# Patient Record
Sex: Male | Born: 1975 | Race: Black or African American | Hispanic: No | Marital: Married | Smoking: Never smoker
Health system: Southern US, Community
[De-identification: ages and names within clinical notes are randomized; demographics above are authoritative.]

## PROBLEM LIST (undated history)

## (undated) DIAGNOSIS — E785 Hyperlipidemia, unspecified: Secondary | ICD-10-CM

## (undated) DIAGNOSIS — D849 Immunodeficiency, unspecified: Secondary | ICD-10-CM

## (undated) DIAGNOSIS — I10 Essential (primary) hypertension: Secondary | ICD-10-CM

## (undated) DIAGNOSIS — E119 Type 2 diabetes mellitus without complications: Secondary | ICD-10-CM

## (undated) DIAGNOSIS — E7800 Pure hypercholesterolemia, unspecified: Secondary | ICD-10-CM

---

## 2012-01-11 DIAGNOSIS — I498 Other specified cardiac arrhythmias: Secondary | ICD-10-CM | POA: Insufficient documentation

## 2012-01-11 DIAGNOSIS — Z8249 Family history of ischemic heart disease and other diseases of the circulatory system: Secondary | ICD-10-CM | POA: Insufficient documentation

## 2012-01-11 DIAGNOSIS — E785 Hyperlipidemia, unspecified: Secondary | ICD-10-CM | POA: Insufficient documentation

## 2012-01-11 DIAGNOSIS — R079 Chest pain, unspecified: Principal | ICD-10-CM | POA: Insufficient documentation

## 2012-01-11 DIAGNOSIS — E7800 Pure hypercholesterolemia, unspecified: Secondary | ICD-10-CM | POA: Insufficient documentation

## 2012-01-11 DIAGNOSIS — E119 Type 2 diabetes mellitus without complications: Secondary | ICD-10-CM | POA: Insufficient documentation

## 2012-01-11 DIAGNOSIS — I10 Essential (primary) hypertension: Secondary | ICD-10-CM | POA: Insufficient documentation

## 2012-01-12 ENCOUNTER — Other Ambulatory Visit: Payer: Self-pay

## 2012-01-12 ENCOUNTER — Observation Stay (HOSPITAL_COMMUNITY)
Admission: EM | Admit: 2012-01-12 | Discharge: 2012-01-12 | Disposition: A | Discharge status: Home / Self Care | Payer: PRIVATE HEALTH INSURANCE | Attending: Internal Medicine | Admitting: Internal Medicine

## 2012-01-12 ENCOUNTER — Emergency Department (HOSPITAL_COMMUNITY)
Admit: 2012-01-12 | Discharge: 2012-01-12 | Disposition: A | Discharge status: Home / Self Care | Payer: PRIVATE HEALTH INSURANCE | Attending: Emergency Medicine | Admitting: Emergency Medicine

## 2012-01-12 ENCOUNTER — Encounter (HOSPITAL_COMMUNITY): Payer: Self-pay | Admitting: *Deleted

## 2012-01-12 DIAGNOSIS — R079 Chest pain, unspecified: Secondary | ICD-10-CM | POA: Diagnosis present

## 2012-01-12 DIAGNOSIS — E119 Type 2 diabetes mellitus without complications: Secondary | ICD-10-CM | POA: Diagnosis present

## 2012-01-12 DIAGNOSIS — I498 Other specified cardiac arrhythmias: Secondary | ICD-10-CM

## 2012-01-12 DIAGNOSIS — E785 Hyperlipidemia, unspecified: Secondary | ICD-10-CM | POA: Diagnosis present

## 2012-01-12 DIAGNOSIS — I10 Essential (primary) hypertension: Secondary | ICD-10-CM | POA: Diagnosis present

## 2012-01-12 DIAGNOSIS — R001 Bradycardia, unspecified: Secondary | ICD-10-CM | POA: Diagnosis present

## 2012-01-12 HISTORY — DX: Immunodeficiency, unspecified: D84.9

## 2012-01-12 HISTORY — DX: Hyperlipidemia, unspecified: E78.5

## 2012-01-12 HISTORY — DX: Type 2 diabetes mellitus without complications: E11.9

## 2012-01-12 HISTORY — DX: Pure hypercholesterolemia, unspecified: E78.00

## 2012-01-12 HISTORY — DX: Essential (primary) hypertension: I10

## 2012-01-12 LAB — COMPREHENSIVE METABOLIC PANEL
AST: 16 U/L (ref 0–37)
Albumin: 4.2 g/dL (ref 3.5–5.2)
BUN: 24 mg/dL — ABNORMAL HIGH (ref 6–23)
Creatinine, Ser: 1.08 mg/dL (ref 0.50–1.35)
Total Protein: 7.6 g/dL (ref 6.0–8.3)

## 2012-01-12 LAB — LIPID PANEL
Cholesterol: 161 mg/dL (ref 0–200)
LDL Cholesterol: 85 mg/dL (ref 0–99)
Total CHOL/HDL Ratio: 2.9 RATIO
VLDL: 20 mg/dL (ref 0–40)

## 2012-01-12 LAB — CBC WITH DIFFERENTIAL/PLATELET
Basophils Absolute: 0 10*3/uL (ref 0.0–0.1)
Basophils Relative: 0 % (ref 0–1)
Eosinophils Absolute: 0.1 10*3/uL (ref 0.0–0.7)
HCT: 40.3 % (ref 39.0–52.0)
Hemoglobin: 13.5 g/dL (ref 13.0–17.0)
MCH: 28.3 pg (ref 26.0–34.0)
MCHC: 33.5 g/dL (ref 30.0–36.0)
Monocytes Absolute: 0.4 10*3/uL (ref 0.1–1.0)
Monocytes Relative: 5 % (ref 3–12)
Neutro Abs: 3.6 10*3/uL (ref 1.7–7.7)
Neutrophils Relative %: 50 % (ref 43–77)
RDW: 13.6 % (ref 11.5–15.5)

## 2012-01-12 LAB — GLUCOSE, CAPILLARY
Glucose-Capillary: 99 mg/dL (ref 70–99)
Glucose-Capillary: 88 mg/dL (ref 70–99)
Glucose-Capillary: 84 mg/dL (ref 70–99)

## 2012-01-12 LAB — CREATININE, SERUM
Creatinine, Ser: 1.02 mg/dL (ref 0.50–1.35)
GFR calc Af Amer: >90 mL/min (ref >90)
GFR calc non Af Amer: >90 mL/min (ref >90)

## 2012-01-12 LAB — CBC
HCT: 40.1 % (ref 39.0–52.0)
Platelets: 274 10*3/uL (ref 150–400)
RDW: 13.8 % (ref 11.5–15.5)
WBC: 7.2 10*3/uL (ref 4.0–10.5)

## 2012-01-12 LAB — TROPONIN I: Troponin I: <0.3 ng/mL (ref <0.30)

## 2012-01-12 MED ORDER — ACETAMINOPHEN 650 MG RE SUPP: 650.0000 mg | Freq: Four times a day (QID) | RECTAL | Status: DC | PRN

## 2012-01-12 MED ORDER — LISINOPRIL 2.5 MG PO TABS
2.5000 mg | ORAL_TABLET | Freq: Every day | ORAL | Status: DC
  Administered 2012-01-12: 2.5 mg via ORAL
  Filled 2012-01-12: qty 1

## 2012-01-12 MED ORDER — ACETAMINOPHEN 325 MG PO TABS: 650.0000 mg | ORAL_TABLET | Freq: Four times a day (QID) | ORAL | Status: DC | PRN

## 2012-01-12 MED ORDER — DOCUSATE SODIUM 100 MG PO CAPS
100.0000 mg | ORAL_CAPSULE | Freq: Two times a day (BID) | ORAL | Status: DC
  Administered 2012-01-12: 100 mg via ORAL
  Filled 2012-01-12 (×2): qty 1

## 2012-01-12 MED ORDER — INSULIN ASPART 100 UNIT/ML ~~LOC~~ SOLN
0.0000 [IU] | Freq: Every day | SUBCUTANEOUS | Status: DC
Start: 2012-01-12 — End: 2012-01-12

## 2012-01-12 MED ORDER — SODIUM CHLORIDE 0.9 % IJ SOLN
3.0000 mL | INTRAMUSCULAR | Status: DC | PRN
  Administered 2012-01-12: 3 mL via INTRAVENOUS

## 2012-01-12 MED ORDER — ONDANSETRON HCL 4 MG PO TABS: 4.0000 mg | ORAL_TABLET | Freq: Four times a day (QID) | ORAL | Status: DC | PRN

## 2012-01-12 MED ORDER — ONDANSETRON HCL 4 MG/2ML IJ SOLN: 4.0000 mg | Freq: Four times a day (QID) | INTRAMUSCULAR | Status: DC | PRN

## 2012-01-12 MED ORDER — SODIUM CHLORIDE 0.9 % IJ SOLN: 3.0000 mL | Freq: Two times a day (BID) | INTRAMUSCULAR | Status: DC

## 2012-01-12 MED ORDER — HYDROCHLOROTHIAZIDE 25 MG PO TABS
25.0000 mg | ORAL_TABLET | Freq: Every day | ORAL | Status: DC
  Administered 2012-01-12: 25 mg via ORAL
  Filled 2012-01-12: qty 1

## 2012-01-12 MED ORDER — ENOXAPARIN SODIUM 40 MG/0.4ML ~~LOC~~ SOLN
40.0000 mg | SUBCUTANEOUS | Status: DC
  Administered 2012-01-12: 40 mg via SUBCUTANEOUS
  Filled 2012-01-12: qty 0.4

## 2012-01-12 MED ORDER — PANTOPRAZOLE SODIUM 40 MG PO TBEC
40.0000 mg | DELAYED_RELEASE_TABLET | Freq: Every day | ORAL | Status: DC
  Administered 2012-01-12: 40 mg via ORAL
  Filled 2012-01-12: qty 1

## 2012-01-12 MED ORDER — HYDROCODONE-ACETAMINOPHEN 5-325 MG PO TABS: 1.0000 | ORAL_TABLET | ORAL | Status: DC | PRN

## 2012-01-12 MED ORDER — IBUPROFEN 800 MG PO TABS: 800.0000 mg | ORAL_TABLET | Freq: Three times a day (TID) | ORAL | Status: DC

## 2012-01-12 MED ORDER — SODIUM CHLORIDE 0.9 % IV SOLN: 250.0000 mL | INTRAVENOUS | Status: DC | PRN

## 2012-01-12 MED ORDER — ATORVASTATIN CALCIUM 10 MG PO TABS
10.0000 mg | ORAL_TABLET | Freq: Every day | ORAL | Status: DC
  Administered 2012-01-12: 10 mg via ORAL
  Filled 2012-01-12: qty 1

## 2012-01-12 MED ORDER — ALUM & MAG HYDROXIDE-SIMETH 200-200-20 MG/5ML PO SUSP: 30.0000 mL | Freq: Four times a day (QID) | ORAL | Status: DC | PRN

## 2012-01-12 MED ORDER — IBUPROFEN 800 MG PO TABS
800.0000 mg | ORAL_TABLET | Freq: Three times a day (TID) | ORAL | Status: DC
  Filled 2012-01-12 (×3): qty 1

## 2012-01-12 MED ORDER — ASPIRIN 81 MG PO CHEW
324.0000 mg | CHEWABLE_TABLET | Freq: Once | ORAL | Status: AC
  Administered 2012-01-12: 324 mg via ORAL
  Filled 2012-01-12: qty 4

## 2012-01-12 MED ORDER — ASPIRIN EC 81 MG PO TBEC
81.0000 mg | DELAYED_RELEASE_TABLET | Freq: Every day | ORAL | Status: DC
  Administered 2012-01-12: 81 mg via ORAL
  Filled 2012-01-12: qty 1

## 2012-01-12 MED ORDER — INSULIN ASPART 100 UNIT/ML ~~LOC~~ SOLN: 0.0000 [IU] | Freq: Three times a day (TID) | SUBCUTANEOUS | Status: DC

## 2012-01-12 NOTE — Consult Note (Signed)
Admit date: 01/12/2012 Referring Physician  Dr. Janee Morn Primary Physician Pcp Not In System Primary Cardiologist  Dr. Anne Fu Reason for Consultation  chest pain  HPI: 36 year old male with diabetes, hypertension, hyperlipidemia, early family history of CAD with his father having a cardiac death in his mid 5s here with chest pain with radiation to his left upper arm, squeezing-like sensation intermittent what she describes after taking metformin for the past 2 days. He is currently chest pain-free. Previously taking a deep breath make it feel better. He had some mild lightheadedness. He has had anxiety in the past. No associated shortness of breath, diaphoresis, vomiting. He exercises at the gym quite frequently and this does not bother him. I evaluated him in December of 2012 performed an exercise treadmill test that was reassuring exercising 9 minutes and 30 seconds with no ST segment changes.    PMH:   Past Medical History  Diagnosis Date  . Hypertension   . Immune deficiency disorder   . Diabetes mellitus without complication   . Hypercholesteremia   . Hyperlipidemia 01/12/2012    PSH:  History reviewed. No pertinent past surgical history. Allergies:  Review of patient's allergies indicates no known allergies. Prior to Admit Meds:   Prescriptions prior to admission  Medication Sig Dispense Refill  . atorvastatin (LIPITOR) 10 MG tablet Take 10 mg by mouth daily.      . hydrochlorothiazide (HYDRODIURIL) 25 MG tablet Take 25 mg by mouth daily.      Marland Kitchen lisinopril (PRINIVIL,ZESTRIL) 2.5 MG tablet Take 2.5 mg by mouth daily.      . metFORMIN (GLUCOPHAGE) 500 MG tablet Take 500 mg by mouth 2 (two) times daily with a meal.      . Omega-3 Fatty Acids (FISH OIL PO) Take 1 capsule by mouth 2 (two) times daily.      . propranolol (INDERAL) 10 MG tablet Take 10 mg by mouth 2 (two) times daily.       Fam HX:    Family History  Problem Relation Age of Onset  . Hypertension Mother   . Kidney  failure Mother   . Diabetes Father    Social HX:    History   Social History  . Marital Status: Married    Spouse Name: N/A    Number of Children: N/A  . Years of Education: N/A   Occupational History  . Not on file.   Social History Main Topics  . Smoking status: Never Smoker   . Smokeless tobacco: Not on file  . Alcohol Use: No  . Drug Use: No  . Sexually Active:    Other Topics Concern  . Not on file   Social History Narrative  . No narrative on file     ROS: Denies any syncope, bleeding, orthopnea, PND, palpitations, strokelike symptoms All 11 ROS were addressed and are negative except what is stated in the HPI  Physical Exam: Blood pressure 110/70, pulse 52, temperature 97.3 F (36.3 C), temperature source Oral, resp. rate 16, height 5\' 8"  (1.727 m), weight 100.426 kg (221 lb 6.4 oz), SpO2 100.00%.    General: Well developed, well nourished, in no acute distress Head: Eyes PERRLA, No xanthomas.   Normal cephalic and atramatic  Lungs:   Clear bilaterally to auscultation and percussion. Normal respiratory effort. No wheezes, no rales. Heart:   HRRR S1 S2 Pulses are 2+ & equal. No m/r/g            No carotid bruit. No JVD.  No abdominal bruits. No femoral bruits. Abdomen: Bowel sounds are positive, abdomen soft and non-tender without masses. No hepatosplenomegaly. Msk:  Back normal. Normal strength and tone for age. Extremities:   No clubbing, cyanosis or edema.  DP +1 Neuro: Alert and oriented X 3, non-focal, MAE x 4 GU: Deferred Rectal: Deferred Psych:  Good affect, responds appropriately    Labs:   Lab Results  Component Value Date   WBC 7.2 01/12/2012   HGB 13.3 01/12/2012   HCT 40.1 01/12/2012   MCV 84.4 01/12/2012   PLT 274 01/12/2012    Lab 01/12/12 0710 01/12/12 0025  NA -- 141  K -- 4.3  CL -- 104  CO2 -- 28  BUN -- 24*  CREATININE 1.02 --  CALCIUM -- 10.0  PROT -- 7.6  BILITOT -- 0.2*  ALKPHOS -- 57  ALT -- 17  AST -- 16  GLUCOSE --  104*   No results found for this basename: PTT   No results found for this basename: INR, PROTIME   Lab Results  Component Value Date   TROPONINI <0.30 01/12/2012     Lab Results  Component Value Date   CHOL 161 01/12/2012   Lab Results  Component Value Date   HDL 56 01/12/2012   Lab Results  Component Value Date   LDLCALC 85 01/12/2012   Lab Results  Component Value Date   TRIG 102 01/12/2012   Lab Results  Component Value Date   CHOLHDL 2.9 01/12/2012   No results found for this basename: LDLDIRECT      Radiology:  Dg Chest 2 View  01/12/2012  *RADIOLOGY REPORT*  Clinical Data: Chest pain.  CHEST - 2 VIEW  Comparison: 06/03/2010 chest radiograph from [facility]  Findings: Cardiac and mediastinal contours appear normal.  The lungs appear clear.  No pleural effusion is identified.  IMPRESSION:  No significant abnormality identified.   Original Report Authenticated By: Dellia Cloud, M.D.    Personally viewed.  EKG:  Sinus bradycardia with no ST segment changes, no change from prior EKG. Telemetry overnight occasional heart rate upper 40s low 50s. Asymptomatic. Personally viewed.   ASSESSMENT/PLAN:   36 year old male with multiple cardiac risk factors including diabetes, hypertension, hyperlipidemia, family history of premature coronary artery disease with substernal chest pain after restarting his metformin with radiation to his left upper extremity.  1. Chest pain-prior stress test in December reviewed, he was able to exercise for 9 minutes and 30 seconds without any ST segment changes. Overall low risk study. However given his multiple cardiac risk factors and recurrence of chest discomfort including left upper arm discomfort, I will set him up for a nuclear stress test/treadmill in the outpatient setting. His chest discomfort is still somewhat atypical and seems to be associated with possible metformin. I'm not sure if I can explain that. Troponin is  normal. EKG silent. Chest pain-free. Echocardiogram currently being performed, personally viewed shows no wall motion abnormality. Final read to follow. No evidence of pericardial effusion.  2. Bradycardia-agree with holding beta blockers. I would likely discontinue them prior to discharge.  3. Diabetes-may need separate agent other than metformin. Per primary team.  4. Early family history of CAD-father died in his 78s, MI  I will contact him via my office on Monday to schedule stress test. He knows to contact us if any further worrisome symptoms develop. Refrain from heavy exercise until stress test is performed. From my point of view, I am fine with him  being discharged with close followup.  Donato Schultz, MD  01/12/2012  10:58 AM

## 2012-01-12 NOTE — Discharge Summary (Signed)
Physician Discharge Summary  Cole Ford:[id number] DOB: [birth year] DOA: 01/12/2012  PCP: Pcp Not In System  Admit date: 01/12/2012 Discharge date: 01/12/2012  Recommendations for Outpatient Follow-up:  1. Patient will be called by [facility] cardiology followup appointment for outpatient nuclear stress test and followup with Dr. Anne Fu of [facility] cardiology. 2. Patient is to followup with his PCP one week post discharge. On followup, patient's blood pressure need to be reassessed as the propranolol has been discontinued secondary to bradycardia. Patient's diabetes also need to be reassessed as his metformin has been discontinued secondary to his episodes of chest pain. PCP may consider starting patient on a different oral hypoglycemic agent.  Discharge Diagnoses:  Principal Problem:  *Chest pain Active Problems:  Diabetes  Hypertension  Bradycardia  Hyperlipidemia   Discharge Condition: Stable and improved  Diet recommendation: Carb modified diet  Filed Weights   01/12/12 0535  Weight: 100.426 kg (221 lb 6.4 oz)    History of present illness:  Cole Ford is a 36 y.o. male  has a past medical history of Hypertension; Immune deficiency disorder; Diabetes mellitus without complication; and Hypercholesteremia.  Presented with  Restarted his metformin 2 days ago and started to have chest pain that is squeezing like. Radiating to left arm and between shoulders. Taking deep breath makes it better. He have had some lightheadedness. No SOB, no nausea or vomiting. Chest pain worse with stress. Going to the Gym does not bother him. He have had stress test last December and it was ok. He does not check his blood sugar tries to exercise and have a good diet   Hospital Course:  #1 chest pain Patient was admitted with chest pain with multiple cardiac risk factors of diabetes hypertension probable hyperlipidemia a strong family history of coronary artery disease. Patient was placed on  telemetry and followed. Patient was also placed on aspirin as well. Patient did state that his chest pain had been intermittent and started after taking metformin. EKG which was done did show a bradycardia however no ST-T wave changes. Cardiac enzymes which was cycled were negative x3. A fasting lipid panel which was obtained showed a LDL of 85. Patient improved clinically did not have any further chest pain during the hospitalization. Due to his multiple risk factors a cardiac consultation was obtained and patient was seen in consultation by Dr. Anne Fu of [facility] cardiology on 01/12/2012. Patient had a 2-D echo done had a normal ejection fraction with no wall motion abnormalities. It was noted that patient did have an exercise stress test done in December of 2012 for 9 minutes and 30 seconds without any ST segment changes. It was felt that patient's overall risk was low however due to his multiple risk factors recurrence of his chest discomfort with left upper extremity discomfort and was recommended that patient will benefit from a nuclear stress test in the outpatient setting. Patient was also placed on scheduled ibuprofen. Patient will be discharged home on scheduled ibuprofen for the next 5 days and he will be scheduled for outpatient nuclear stress test per cardiology. Patient be discharged in stable and improved condition.  #2 bradycardia On admission patient was noted to have a bradycardia. Cardiac enzymes which was cycled were negative x3. 2-D echo which was obtained did have a normal ejection fraction of 55-60% with no wall motion abnormalities. Patient was asymptomatic. Patient's propranolol was discontinued and will not be resumed on discharge. Patient will followup with cardiology as outpatient.  #3 hypertension Remained stable throughout  the hospitalization. Patient was maintained on his home regimen of lisinopril and HCTZ. Patient's propranolol was discontinued secondary to bradycardia. Patient  will followup with his PCP as outpatient.  #4 diabetes mellitus Controlled. Patient CBGs remained in the 80s during the hospitalization. Patient was on metformin at home however after starting his metformin he did have some intermittent chest pain which was similar to his episode 1 year ago. Patient's metformin has been discontinued and will not be resumed on discharge. Patient is to followup with his PCP one week post discharge for his assessment of diabetes and possible switch of metformin to another oral hypoglycemic agent.  Procedures:  2-D echo 01/12/2012  Consultations:  Cardiology: Dr. Anne Fu 01/12/2012  Discharge Exam: Filed Vitals:   01/12/12 0407 01/12/12 0535 01/12/12 0930 01/12/12 1300  BP: 117/75 123/64 110/70 116/64  Pulse: 52 52  61  Temp:  97.3 F (36.3 C)  97.3 F (36.3 C)  TempSrc:    Oral  Resp:  16  20  Height:  5\' 8"  (1.727 m)    Weight:  100.426 kg (221 lb 6.4 oz)    SpO2: 100% 100%  100%    General: NAD Cardiovascular: RRR Respiratory: Soft/NT/ND/+BS  Discharge Instructions  Discharge Orders    Future Orders Please Complete By Expires   Diet Carb Modified      Increase activity slowly      Discharge instructions      Comments:   Follow up with Dr Anne Fu next week Follow up with Pcp in 1 week.       Medication List     As of 01/12/2012  5:14 PM    STOP taking these medications         metFORMIN 500 MG tablet   Commonly known as: GLUCOPHAGE      propranolol 10 MG tablet   Commonly known as: INDERAL      TAKE these medications         atorvastatin 10 MG tablet   Commonly known as: LIPITOR   Take 10 mg by mouth daily.      FISH OIL PO   Take 1 capsule by mouth 2 (two) times daily.      hydrochlorothiazide 25 MG tablet   Commonly known as: HYDRODIURIL   Take 25 mg by mouth daily.      ibuprofen 800 MG tablet   Commonly known as: ADVIL,MOTRIN   Take 1 tablet (800 mg total) by mouth 3 (three) times daily. Take 1 tablet 3 times  daily for 5 days then take as needed.      lisinopril 2.5 MG tablet   Commonly known as: PRINIVIL,ZESTRIL   Take 2.5 mg by mouth daily.           Follow-up Information    Please follow up. (Followup with PCP one week post discharge)       Follow up with Donato Schultz, MD. Schedule an appointment as soon as possible for a visit in 1 week. (Dr. Anne Fu office will call with outpatient appointment for stress test)    Contact information:   [address] [place] [address] [phone]           The results of significant diagnostics from this hospitalization (including imaging, microbiology, ancillary and laboratory) are listed below for reference.    Significant Diagnostic Studies: Dg Chest 2 View  01/12/2012  *RADIOLOGY REPORT*  Clinical Data: Chest pain.  CHEST - 2 VIEW  Comparison: 06/03/2010 chest radiograph from  [facility]  Findings: Cardiac and mediastinal contours appear normal.  The lungs appear clear.  No pleural effusion is identified.  IMPRESSION:  No significant abnormality identified.   Original Report Authenticated By: Dellia Cloud, M.D.     Microbiology: No results found for this or any previous visit (from the past 240 hour(s)).   Labs: Basic Metabolic Panel:  Lab 01/12/12 4010 01/12/12 0025  NA -- 141  K -- 4.3  CL -- 104  CO2 -- 28  GLUCOSE -- 104*  BUN -- 24*  CREATININE 1.02 1.08  CALCIUM -- 10.0  MG -- --  PHOS -- --   Liver Function Tests:  Lab 01/12/12 0025  AST 16  ALT 17  ALKPHOS 57  BILITOT 0.2*  PROT 7.6  ALBUMIN 4.2   No results found for this basename: LIPASE:5,AMYLASE:5 in the last 168 hours No results found for this basename: AMMONIA:5 in the last 168 hours CBC:  Lab 01/12/12 0710 01/12/12 0025  WBC 7.2 7.3  NEUTROABS -- 3.6  HGB 13.3 13.5  HCT 40.1 40.3  MCV 84.4 84.5  PLT 274 286   Cardiac Enzymes:  Lab 01/12/12 1141 01/12/12 0710 01/12/12 0025  CKTOTAL -- -- --  CKMB -- -- --  CKMBINDEX  -- -- --  TROPONINI <0.30 <0.30 <0.30   BNP: BNP (last 3 results) No results found for this basename: PROBNP:3 in the last 8760 hours CBG:  Lab 01/12/12 1208 01/12/12 0620  GLUCAP 88 99    Time coordinating discharge: 60 minutes  Signed:  Zaneta Lightcap  [facility] 01/12/2012, 5:14 PM

## 2012-01-12 NOTE — Progress Notes (Signed)
I have seen and assessed the patient and I agree Dr. Celene Kras was assessment and plan. Patient is a 36 year old gentleman history of diabetes, hypertension, hyperlipidemia, family history of premature coronary artery disease who presents with substernal chest pain radiating to the left upper extremity. Patient states had a stress test done as outpatient in December of 2012 which was normal. Patient however states this time around his chest pain with some radiation to the left upper extremity. Patient does have multiple risk factors and a strong family history of premature coronary artery disease. Troponins are negative x2. Will order a 2-D echo. Will consult with cardiology for further evaluation and management. We'll also place patient on scheduled ibuprofen and follow.

## 2012-01-12 NOTE — ED Notes (Signed)
The pt has had lt upper chest pain since yesterday with some lt arm radiation and posterorly  With dizzinesss

## 2012-01-12 NOTE — ED Notes (Signed)
The pt has no chest pain 

## 2012-01-12 NOTE — ED Provider Notes (Signed)
History     CSN: [id number]  Arrival date & time 01/11/12  2355   First MD Initiated Contact with Patient 01/12/12 0256      Chief Complaint  Patient presents with  . Chest Pain    (Consider location/radiation/quality/duration/timing/severity/associated sxs/prior treatment) HPI Hx per PT. L sided CP radiating to L shoulder. Described as tightness. Treated for DM and HTN, mother with CAD. No SOB or nasuea. No trauma, no recent weight lifting. No rash or recent travel, no cough or fevers, no leg pain or swelling. Moderate in severity, takes baby ASA every day last 7 months.  Past Medical History  Diagnosis Date  . Hypertension   . Immune deficiency disorder     History reviewed. No pertinent past surgical history.  No family history on file.  History  Substance Use Topics  . Smoking status: Never Smoker   . Smokeless tobacco: Not on file  . Alcohol Use: No      Review of Systems  Constitutional: Negative for fever and chills.  HENT: Negative for neck pain and neck stiffness.   Eyes: Negative for pain.  Respiratory: Negative for shortness of breath.   Cardiovascular: Positive for chest pain.  Gastrointestinal: Negative for abdominal pain.  Genitourinary: Negative for dysuria.  Musculoskeletal: Negative for back pain.  Skin: Negative for rash.  Neurological: Negative for headaches.  All other systems reviewed and are negative.    Allergies  Review of patient's allergies indicates no known allergies.  Home Medications   Current Outpatient Rx  Name Route Sig Dispense Refill  . ATORVASTATIN CALCIUM 10 MG PO TABS Oral Take 10 mg by mouth daily.    Marland Kitchen HYDROCHLOROTHIAZIDE 25 MG PO TABS Oral Take 25 mg by mouth daily.    Marland Kitchen LISINOPRIL 2.5 MG PO TABS Oral Take 2.5 mg by mouth daily.    Marland Kitchen METFORMIN HCL 500 MG PO TABS Oral Take 500 mg by mouth 2 (two) times daily with a meal.    . FISH OIL PO Oral Take 1 capsule by mouth 2 (two) times daily.    Marland Kitchen PROPRANOLOL HCL 10 MG  PO TABS Oral Take 10 mg by mouth 2 (two) times daily.      BP 131/85  Pulse 48  Temp 97.6 F (36.4 C) (Oral)  SpO2 99%  Physical Exam  Constitutional: He is oriented to person, place, and time. He appears well-developed and well-nourished.  HENT:  Head: Normocephalic and atraumatic.  Eyes: Conjunctivae normal and EOM are normal. Pupils are equal, round, and reactive to light.  Neck: Trachea normal. Neck supple. No thyromegaly present.  Cardiovascular: Normal rate, regular rhythm, S1 normal, S2 normal and normal pulses.     No systolic murmur is present   No diastolic murmur is present  Pulses:      Radial pulses are 2+ on the right side, and 2+ on the left side.  Pulmonary/Chest: Effort normal and breath sounds normal. He has no wheezes. He has no rhonchi. He has no rales. He exhibits no tenderness.  Abdominal: Soft. Normal appearance and bowel sounds are normal. There is no tenderness. There is no CVA tenderness and negative Murphy's sign.  Musculoskeletal:       BLE:s Calves nontender, no cords or erythema, negative Homans sign  Neurological: He is alert and oriented to person, place, and time. He has normal strength. No cranial nerve deficit or sensory deficit. GCS eye subscore is 4. GCS verbal subscore is 5. GCS motor subscore is 6.  Skin: Skin is warm and dry. No rash noted. He is not diaphoretic.  Psychiatric: His speech is normal.       Cooperative and appropriate    ED Course  Procedures (including critical care time)  Results for orders placed during the hospital encounter of 01/12/12  CBC WITH DIFFERENTIAL      Component Value Range   WBC 7.3  4.0 - 10.5 K/uL   RBC 4.77  4.22 - 5.81 MIL/uL   Hemoglobin 13.5  13.0 - 17.0 g/dL   HCT 16.1  09.6 - 04.5 %   MCV 84.5  78.0 - 100.0 fL   MCH 28.3  26.0 - 34.0 pg   MCHC 33.5  30.0 - 36.0 g/dL   RDW 40.9  81.1 - 91.4 %   Platelets 286  150 - 400 K/uL   Neutrophils Relative 50  43 - 77 %   Neutro Abs 3.6  1.7 - 7.7 K/uL     Lymphocytes Relative 43  12 - 46 %   Lymphs Abs 3.2  0.7 - 4.0 K/uL   Monocytes Relative 5  3 - 12 %   Monocytes Absolute 0.4  0.1 - 1.0 K/uL   Eosinophils Relative 1  0 - 5 %   Eosinophils Absolute 0.1  0.0 - 0.7 K/uL   Basophils Relative 0  0 - 1 %   Basophils Absolute 0.0  0.0 - 0.1 K/uL  COMPREHENSIVE METABOLIC PANEL      Component Value Range   Sodium 141  135 - 145 mEq/L   Potassium 4.3  3.5 - 5.1 mEq/L   Chloride 104  96 - 112 mEq/L   CO2 28  19 - 32 mEq/L   Glucose, Bld 104 (*) 70 - 99 mg/dL   BUN 24 (*) 6 - 23 mg/dL   Creatinine, Ser 7.82  0.50 - 1.35 mg/dL   Calcium 95.6  8.4 - 21.3 mg/dL   Total Protein 7.6  6.0 - 8.3 g/dL   Albumin 4.2  3.5 - 5.2 g/dL   AST 16  0 - 37 U/L   ALT 17  0 - 53 U/L   Alkaline Phosphatase 57  39 - 117 U/L   Total Bilirubin 0.2 (*) 0.3 - 1.2 mg/dL   GFR calc non Af Amer 88 (*) >90 mL/min   GFR calc Af Amer >90  >90 mL/min  TROPONIN I      Component Value Range   Troponin I <0.30  <0.30 ng/mL   Dg Chest 2 View  01/12/2012  *RADIOLOGY REPORT*  Clinical Data: Chest pain.  CHEST - 2 VIEW  Comparison: 06/03/2010 chest radiograph from [facility]  Findings: Cardiac and mediastinal contours appear normal.  The lungs appear clear.  No pleural effusion is identified.  IMPRESSION:  No significant abnormality identified.   Original Report Authenticated By: Dellia Cloud, M.D.       Date: 01/12/2012  Rate: 53  Rhythm: sinus bradycardia  QRS Axis: normal  Intervals: normal  ST/T Wave abnormalities: nonspecific ST changes  Conduction Disutrbances:none  Narrative Interpretation:   Old EKG Reviewed: none available  ASA.   Serial evaluations with pain resolved in ED.   3:37 AM d/w [facility] on call Dr Kara Pacer, plan admit.  MDM   CP with multiple risk factors. ECG, labs, xray reviewed. Plan MED admit        Sunnie Nielsen, MD 01/12/12 [phone]

## 2012-01-12 NOTE — Progress Notes (Signed)
All d/c instructions explained and given to pt.  Verbalized understanding.  D/C home.  Pt refused w/c service.  Amanda Pea, Charity fundraiser.

## 2012-01-12 NOTE — Progress Notes (Signed)
  Echocardiogram 2D Echocardiogram has been performed.  Cole Ford 01/12/2012, 12:31 PM

## 2012-01-12 NOTE — ED Notes (Signed)
Report called to alecia on 4700

## 2012-01-12 NOTE — H&P (Signed)
PCP:  [facility]    Chief Complaint:   Chest pain  HPI: Cole Ford is a 36 y.o. male   has a past medical history of Hypertension; Immune deficiency disorder; Diabetes mellitus without complication; and Hypercholesteremia.   Presented with  Restarted his metformin 2 days ago and started to have chest pain that is squeezing like. Radiating to left arm and between shoulders. Taking deep breath makes it better. He have had some lightheadedness. No SOB, no nausea or vomiting. Chest pain worse with stress. Going to the Gym does not bother him. He have had stress test last December and it was ok. He does not check his blood sugar tries to exercise and have a good diet.   Review of Systems:    Pertinent positives include: chest pain, he has frequent band like headaches.   Constitutional:  No weight loss, night sweats, Fevers, chills, fatigue, weight loss  HEENT:  No headaches, Difficulty swallowing,Tooth/dental problems,Sore throat,  No sneezing, itching, ear ache, nasal congestion, post nasal drip,  Cardio-vascular:  No Orthopnea, PND, anasarca, dizziness, palpitations.no Bilateral lower extremity swelling  GI:  No heartburn, indigestion, abdominal pain, nausea, vomiting, diarrhea, change in bowel habits, loss of appetite, melena, blood in stool, hematemesis Resp:  no shortness of breath at rest. No dyspnea on exertion, No excess mucus, no productive cough, No non-productive cough, No coughing up of blood.No change in color of mucus.No wheezing. Skin:  no rash or lesions. No jaundice GU:  no dysuria, change in color of urine, no urgency or frequency. No straining to urinate.  No flank pain.  Musculoskeletal:  No joint pain or no joint swelling. No decreased range of motion. No back pain.  Psych:  No change in mood or affect. No depression or anxiety. No memory loss.  Neuro: no localizing neurological complaints, no tingling, no weakness, no double vision, no gait abnormality, no  slurred speech, no confusion  Otherwise ROS are negative except for above, 10 systems were reviewed  Past Medical History: Past Medical History  Diagnosis Date  . Hypertension   . Immune deficiency disorder   . Diabetes mellitus without complication   . Hypercholesteremia    History reviewed. No pertinent past surgical history.   Medications: Prior to Admission medications   Medication Sig Start Date End Date Taking? Authorizing Provider  atorvastatin (LIPITOR) 10 MG tablet Take 10 mg by mouth daily.   Yes Historical Provider, MD  hydrochlorothiazide (HYDRODIURIL) 25 MG tablet Take 25 mg by mouth daily.   Yes Historical Provider, MD  lisinopril (PRINIVIL,ZESTRIL) 2.5 MG tablet Take 2.5 mg by mouth daily.   Yes Historical Provider, MD  metFORMIN (GLUCOPHAGE) 500 MG tablet Take 500 mg by mouth 2 (two) times daily with a meal.   Yes Historical Provider, MD  Omega-3 Fatty Acids (FISH OIL PO) Take 1 capsule by mouth 2 (two) times daily.   Yes Historical Provider, MD  propranolol (INDERAL) 10 MG tablet Take 10 mg by mouth 2 (two) times daily.   Yes Historical Provider, MD    Allergies:  No Known Allergies  Social History:  Ambulatory  independently Lives at home   reports that he has never smoked. He does not have any smokeless tobacco history on file. He reports that he does not drink alcohol or use illicit drugs.   Family History: family history includes Diabetes in his father; Hypertension in his mother; and Kidney failure in his mother.    Physical Exam: Patient Vitals for the past  24 hrs:  BP Temp Temp src Pulse Resp SpO2  01/12/12 0407 117/75 mmHg - - 52  - 100 %  01/12/12 0315 116/66 mmHg - - 49  18  98 %  01/12/12 0300 109/68 mmHg - - 49  17  92 %  01/12/12 0245 103/64 mmHg - - 52  17  92 %  01/12/12 0230 116/77 mmHg - - 48  11  96 %  01/12/12 0215 116/77 mmHg - - 54  15  100 %  01/12/12 0200 119/79 mmHg - - 46  20  98 %  01/12/12 0145 116/55 mmHg - - 46  17  98 %    01/12/12 0130 124/87 mmHg - - 48  25  98 %  01/12/12 0119 131/85 mmHg 97.6 F (36.4 C) - 48  - 99 %  01/12/12 0003 131/77 mmHg 97.6 F (36.4 C) Oral 52  - 99 %    1. General:  in No Acute distress 2. Psychological: Alert and   Oriented 3. Head/ENT:   Moist  Mucous Membranes                          Head Non traumatic, neck supple                          Normal   Dentition 4. SKIN: normal  Skin turgor,  Skin clean Dry and intact no rash 5. Heart: Regular rate and rhythm no Murmur, Rub or gallop 6. Lungs: Clear to auscultation bilaterally, no wheezes or crackles   7. Abdomen: Soft, non-tender, Non distended 8. Lower extremities: no clubbing, cyanosis, or edema 9. Neurologically Grossly intact, moving all 4 extremities equally 10. MSK: Normal range of motion  body mass index is unknown because there is no height or weight on file.   Labs on Admission:   [facility] 01/12/12 0025  NA 141  K 4.3  CL 104  CO2 28  GLUCOSE 104*  BUN 24*  CREATININE 1.08  CALCIUM 10.0  MG --  PHOS --    Basename 01/12/12 0025  AST 16  ALT 17  ALKPHOS 57  BILITOT 0.2*  PROT 7.6  ALBUMIN 4.2   No results found for this basename: LIPASE:2,AMYLASE:2 in the last 72 hours  Basename 01/12/12 0025  WBC 7.3  NEUTROABS 3.6  HGB 13.5  HCT 40.3  MCV 84.5  PLT 286    Basename 01/12/12 0025  CKTOTAL --  CKMB --  CKMBINDEX --  TROPONINI <0.30   No results found for this basename: TSH,T4TOTAL,FREET3,T3FREE,THYROIDAB in the last 72 hours No results found for this basename: VITAMINB12:2,FOLATE:2,FERRITIN:2,TIBC:2,IRON:2,RETICCTPCT:2 in the last 72 hours No results found for this basename: HGBA1C    CrCl is unknown because there is no height on file for the current visit. ABG No results found for this basename: phart, pco2, po2, hco3, tco2, acidbasedef, o2sat     No results found for this basename: DDIMER     Other results:  I have pearsonaly reviewed this: ECG REPORT  Rate: 52   Rhythm: Sinus bradycardia ST&T Change: T wave inv in III, AvF   Cultures: No results found for this basename: sdes, specrequest, cult, reptstatus       Radiological Exams on Admission: Dg Chest 2 View  01/12/2012  *RADIOLOGY REPORT*  Clinical Data: Chest pain.  CHEST - 2 VIEW  Comparison: 06/03/2010 chest radiograph from [facility]  Findings: Cardiac and  mediastinal contours appear normal.  The lungs appear clear.  No pleural effusion is identified.  IMPRESSION:  No significant abnormality identified.   Original Report Authenticated By: Dellia Cloud, M.D.     Chart has been reviewed  Assessment/Plan  36 yo w atypical Chest pain and risk factors.   Present on Admission:  .Chest pain - - given risk factors will admit, monitor on telemetry, cycle cardiac enzymes, obtain serial ECG. Further risk stratify with lipid panel, hgA1C, obtain TSH. Make sure patient is on Aspirin. Further treatment based on the currently pending results.   .Diabetes - hold metformin,  start on sliding scale insulin  .Hypertension - continue home medications the exception of propranolol  .Bradycardia - hold beta blockers patient is bradycardic with low normal blood pressures.   Prophylaxis:   Lovenox, Protonix  CODE STATUS: FULL CODE  Other plan as per orders.  I have spent a total of 55 min on this admission  Beecher Furio 01/12/2012, 4:47 AM

## 2012-01-13 LAB — HEMOGLOBIN A1C: Hgb A1c MFr Bld: 6.4 % — ABNORMAL HIGH (ref <5.7)

## 2012-01-16 ENCOUNTER — Other Ambulatory Visit (HOSPITAL_COMMUNITY): Payer: Self-pay | Admitting: Cardiology

## 2012-01-16 DIAGNOSIS — R079 Chest pain, unspecified: Secondary | ICD-10-CM

## 2012-01-22 ENCOUNTER — Other Ambulatory Visit (HOSPITAL_COMMUNITY): Payer: PRIVATE HEALTH INSURANCE

## 2012-01-23 ENCOUNTER — Encounter (HOSPITAL_COMMUNITY)
Admission: RE | Admit: 2012-01-23 | Discharge: 2012-01-23 | Disposition: A | Discharge status: Home / Self Care | Payer: PRIVATE HEALTH INSURANCE | Source: Ambulatory Visit | Attending: Cardiology | Admitting: Cardiology

## 2012-01-23 ENCOUNTER — Other Ambulatory Visit: Payer: Self-pay

## 2012-01-23 DIAGNOSIS — I10 Essential (primary) hypertension: Secondary | ICD-10-CM | POA: Insufficient documentation

## 2012-01-23 DIAGNOSIS — I498 Other specified cardiac arrhythmias: Secondary | ICD-10-CM | POA: Insufficient documentation

## 2012-01-23 DIAGNOSIS — E785 Hyperlipidemia, unspecified: Secondary | ICD-10-CM | POA: Insufficient documentation

## 2012-01-23 DIAGNOSIS — Z8249 Family history of ischemic heart disease and other diseases of the circulatory system: Secondary | ICD-10-CM | POA: Insufficient documentation

## 2012-01-23 DIAGNOSIS — E7800 Pure hypercholesterolemia, unspecified: Secondary | ICD-10-CM | POA: Insufficient documentation

## 2012-01-23 DIAGNOSIS — R079 Chest pain, unspecified: Secondary | ICD-10-CM | POA: Insufficient documentation

## 2012-01-23 DIAGNOSIS — E119 Type 2 diabetes mellitus without complications: Secondary | ICD-10-CM | POA: Insufficient documentation

## 2012-01-23 MED ORDER — TECHNETIUM TC 99M SESTAMIBI GENERIC - CARDIOLITE
10.0000 | Freq: Once | INTRAVENOUS | Status: AC | PRN
  Administered 2012-01-23: 10 via INTRAVENOUS

## 2012-01-23 MED ORDER — TECHNETIUM TC 99M SESTAMIBI GENERIC - CARDIOLITE
30.0000 | Freq: Once | INTRAVENOUS | Status: AC | PRN
  Administered 2012-01-23: 30 via INTRAVENOUS

## 2013-07-18 ENCOUNTER — Encounter (HOSPITAL_COMMUNITY): Payer: Self-pay | Admitting: Emergency Medicine

## 2013-07-18 ENCOUNTER — Emergency Department (HOSPITAL_COMMUNITY)
Admission: EM | Admit: 2013-07-18 | Discharge: 2013-07-18 | Disposition: A | Discharge status: Home / Self Care | Payer: PRIVATE HEALTH INSURANCE | Attending: Emergency Medicine | Admitting: Emergency Medicine

## 2013-07-18 DIAGNOSIS — Z79899 Other long term (current) drug therapy: Secondary | ICD-10-CM | POA: Insufficient documentation

## 2013-07-18 DIAGNOSIS — E119 Type 2 diabetes mellitus without complications: Secondary | ICD-10-CM | POA: Insufficient documentation

## 2013-07-18 DIAGNOSIS — E785 Hyperlipidemia, unspecified: Secondary | ICD-10-CM | POA: Insufficient documentation

## 2013-07-18 DIAGNOSIS — Z862 Personal history of diseases of the blood and blood-forming organs and certain disorders involving the immune mechanism: Secondary | ICD-10-CM | POA: Insufficient documentation

## 2013-07-18 DIAGNOSIS — I10 Essential (primary) hypertension: Secondary | ICD-10-CM | POA: Insufficient documentation

## 2013-07-18 DIAGNOSIS — E7800 Pure hypercholesterolemia, unspecified: Secondary | ICD-10-CM | POA: Insufficient documentation

## 2013-07-18 DIAGNOSIS — Z8639 Personal history of other endocrine, nutritional and metabolic disease: Secondary | ICD-10-CM | POA: Insufficient documentation

## 2013-07-18 DIAGNOSIS — E860 Dehydration: Secondary | ICD-10-CM

## 2013-07-18 DIAGNOSIS — R739 Hyperglycemia, unspecified: Secondary | ICD-10-CM

## 2013-07-18 DIAGNOSIS — IMO0001 Reserved for inherently not codable concepts without codable children: Secondary | ICD-10-CM | POA: Insufficient documentation

## 2013-07-18 DIAGNOSIS — Z7982 Long term (current) use of aspirin: Secondary | ICD-10-CM | POA: Insufficient documentation

## 2013-07-18 DIAGNOSIS — M791 Myalgia, unspecified site: Secondary | ICD-10-CM

## 2013-07-18 LAB — CBC
HEMATOCRIT: 42.7 % (ref 39.0–52.0)
Hemoglobin: 15 g/dL (ref 13.0–17.0)
MCH: 28.5 pg (ref 26.0–34.0)
MCHC: 35.1 g/dL (ref 30.0–36.0)
MCV: 81 fL (ref 78.0–100.0)
Platelets: 300 10*3/uL (ref 150–400)
RBC: 5.27 MIL/uL (ref 4.22–5.81)
RDW: 13.1 % (ref 11.5–15.5)
WBC: 7.6 10*3/uL (ref 4.0–10.5)

## 2013-07-18 LAB — BASIC METABOLIC PANEL
BUN: 17 mg/dL (ref 6–23)
CHLORIDE: 100 meq/L (ref 96–112)
CO2: 21 meq/L (ref 19–32)
CREATININE: 1.01 mg/dL (ref 0.50–1.35)
Calcium: 10.3 mg/dL (ref 8.4–10.5)
GFR calc Af Amer: >90 mL/min (ref >90)
GFR calc non Af Amer: >90 mL/min (ref >90)
Glucose, Bld: 331 mg/dL — ABNORMAL HIGH (ref 70–99)
Potassium: 4.2 mEq/L (ref 3.7–5.3)
Sodium: 137 mEq/L (ref 137–147)

## 2013-07-18 LAB — URINALYSIS, ROUTINE W REFLEX MICROSCOPIC
BILIRUBIN URINE: NEGATIVE
Glucose, UA: >1000 mg/dL — AB
HGB URINE DIPSTICK: NEGATIVE
Ketones, ur: 15 mg/dL — AB
Leukocytes, UA: NEGATIVE
NITRITE: NEGATIVE
PH: 5 (ref 5.0–8.0)
Protein, ur: 30 mg/dL — AB
Urobilinogen, UA: 0.2 mg/dL (ref 0.0–1.0)

## 2013-07-18 LAB — URINE MICROSCOPIC-ADD ON

## 2013-07-18 LAB — CK: CK TOTAL: 260 U/L — AB (ref 7–232)

## 2013-07-18 LAB — CBG MONITORING, ED: Glucose-Capillary: 270 mg/dL — ABNORMAL HIGH (ref 70–99)

## 2013-07-18 MED ORDER — SODIUM CHLORIDE 0.9 % IV BOLUS (SEPSIS)
1000.0000 mL | Freq: Once | INTRAVENOUS | Status: AC
  Administered 2013-07-18: 1000 mL via INTRAVENOUS

## 2013-07-18 NOTE — ED Notes (Signed)
He states hes had cramps in both of his legs for the past month and he noticed it was worse after he mowed the lawn this morning. He said sometimes his legs feel numb.

## 2013-07-18 NOTE — Discharge Instructions (Signed)
Please read and follow all provided instructions.  Your diagnoses today include:  1. Myalgia   2. Hyperglycemia   3. Dehydration     Tests performed today include:  Blood counts and electrolytes - high blood sugar  Test for muscle breakdown - mildly high, but not dangerously high  Vital signs. See below for your results today.   Medications prescribed:   None  Take any prescribed medications only as directed.  Home care instructions:  Follow any educational materials contained in this packet.  Double fluid intake for next 2 days.   Follow-up instructions: Please follow-up with your primary care provider in the next 3 days for further evaluation of your symptoms, specifically diabetes management.   Return instructions:   Please return to the Emergency Department if you experience worsening symptoms.   Please return if you have any other emergent concerns.  Additional Information:  Your vital signs today were: BP 180/109   Pulse 110   Temp(Src) 97.9 F (36.6 C) (Oral)   Resp 20   Wt 233 lb 5 oz (105.83 kg)   SpO2 98% If your blood pressure (BP) was elevated above 135/85 this visit, please have this repeated by your doctor within one month. --------------

## 2013-07-18 NOTE — ED Provider Notes (Signed)
Medical screening examination/treatment/procedure(s) were performed by non-physician practitioner and as supervising physician I was immediately available for consultation/collaboration.   EKG Interpretation None       Juliet RudeNathan R. Rubin PayorPickering, MD 07/18/13 2114

## 2013-07-18 NOTE — ED Provider Notes (Signed)
CSN: [id number]     Arrival date & time 07/18/13  1632 History   First MD Initiated Contact with Patient 07/18/13 1732     Chief Complaint  Patient presents with  . Leg Pain   (Consider location/radiation/quality/duration/timing/severity/associated sxs/prior Treatment) HPI Comments: Patient with history of diabetes on sulfonylurea, hypertension, hypercholesterolemia -- presents with complaint of leg cramps. Patient states that he has intermittently been having cramps for 1 month. Patient states that it was worse today in his left leg.  This was after he worked out this morning and spent 2 hours mowing his yard. Upon arrival to the emergency department the pain began in his right leg as well. Patient states that he hydrated well while working and states he's been urinating very frequently lately. He does not check his blood sugars at home. Patient had been on a statin medication and restarted this 3 days ago. No fevers, nausea, vomiting, or diarrhea. No lower extremity swelling. No history of blood clots. No risk factors for DVT including immobilizations, recent surgeries. The onset of this condition was acute. The course is constant. Aggravating factors: none. Alleviating factors: none.    Patient is a 38 y.o. male presenting with leg pain. The history is provided by the patient.  Leg Pain Associated symptoms: no fever     Past Medical History  Diagnosis Date  . Hypertension   . Immune deficiency disorder   . Diabetes mellitus without complication   . Hypercholesteremia   . Hyperlipidemia 01/12/2012   History reviewed. No pertinent past surgical history. Family History  Problem Relation Age of Onset  . Hypertension Mother   . Kidney failure Mother   . Diabetes Father    History  Substance Use Topics  . Smoking status: Never Smoker   . Smokeless tobacco: Not on file  . Alcohol Use: No    Review of Systems  Constitutional: Negative for fever.  HENT: Negative for rhinorrhea and  sore throat.   Eyes: Negative for redness.  Respiratory: Negative for cough.   Cardiovascular: Negative for chest pain.  Gastrointestinal: Negative for nausea, vomiting, abdominal pain and diarrhea.  Endocrine: Positive for polydipsia and polyuria.  Genitourinary: Positive for frequency. Negative for dysuria.  Musculoskeletal: Positive for myalgias. Negative for arthralgias.  Skin: Negative for rash.  Neurological: Negative for headaches.   Allergies  Metformin and related  Home Medications   Prior to Admission medications   Medication Sig Start Date End Date Taking? Authorizing Provider  aspirin EC 81 MG tablet Take 81 mg by mouth daily.   Yes Historical Provider, MD  atorvastatin (LIPITOR) 10 MG tablet Take 10 mg by mouth daily.   Yes Historical Provider, MD  Coenzyme Q10 (CO Q 10 PO) Take 1 tablet by mouth daily.   Yes Historical Provider, MD  glipiZIDE (GLUCOTROL) 5 MG tablet Take 5 mg by mouth daily before breakfast.   Yes Historical Provider, MD  Omega-3 Fatty Acids (FISH OIL PO) Take 1 capsule by mouth 2 (two) times daily.   Yes Historical Provider, MD   BP 180/109  Pulse 110  Temp(Src) 97.9 F (36.6 C) (Oral)  Resp 20  Wt 233 lb 5 oz (105.83 kg)  SpO2 98%  Physical Exam  Nursing note and vitals reviewed. Constitutional: He appears well-developed and well-nourished.  HENT:  Head: Normocephalic and atraumatic.  Eyes: Conjunctivae are normal. Right eye exhibits no discharge. Left eye exhibits no discharge.  Neck: Normal range of motion. Neck supple.  Cardiovascular: Normal rate, regular rhythm  and normal heart sounds.   No murmur heard. Pulmonary/Chest: Effort normal and breath sounds normal. No respiratory distress. He has no wheezes. He has no rales.  Abdominal: Soft. There is no tenderness.  Musculoskeletal: Normal range of motion. He exhibits tenderness (mild, generalized, ). He exhibits no edema.  Bilateral LE muscular tenderness without signs of cellulitis,  swelling.   Neurological: He is alert.  Skin: Skin is warm and dry.  Psychiatric: He has a normal mood and affect.    ED Course  Procedures (including critical care time) Labs Review Labs Reviewed  BASIC METABOLIC PANEL - Abnormal; Notable for the following:    Glucose, Bld 331 (*)    All other components within normal limits  CK - Abnormal; Notable for the following:    Total CK 260 (*)    All other components within normal limits  URINALYSIS, ROUTINE W REFLEX MICROSCOPIC - Abnormal; Notable for the following:    Specific Gravity, Urine >1.046 (*)    Glucose, UA >1000 (*)    Ketones, ur 15 (*)    Protein, ur 30 (*)    All other components within normal limits  CBC  URINE MICROSCOPIC-ADD ON    Imaging Review No results found.   EKG Interpretation None      6:18 PM Patient seen and examined. Work-up initiated. Medications ordered.   Vital signs reviewed and are as follows: Filed Vitals:   07/18/13 1649  BP: 180/109  Pulse: 110  Temp: 97.9 F (36.6 C)  Resp: 20   9:06 PM Only mild elevation in CK. Pt informed of results.   He has PCP f/u already planned for next week. Encouraged f/u to discuss how well glipizide is working as well as whether statin medication is best med for him 2/2 myalgias.  Patient urged to return with worsening symptoms or other concerns. Patient verbalized understanding and agrees with plan.   Encouraged to hydrate well.    MDM   Final diagnoses:  Myalgia  Hyperglycemia  Dehydration   Myalgias: likely due to combination of dehydration, statin medication, strenuous exercise and activity today.  Hyperglycemia: No ketosis, patient will likely not meet goal at current dose of glipizide. PCP followup to discuss this.  Dehydration: 3 L of fluid given in ED. Likely partially due to diuresis due to hyperglycemia.  No dangerous or life-threatening conditions suspected or identified by history, physical exam, and by work-up. No indications  for hospitalization identified.     Renne CriglerJoshua Nattalie Santiesteban, PA-C 07/18/13 2110

## 2013-09-11 ENCOUNTER — Encounter: Payer: Self-pay | Admitting: Dietician

## 2013-09-11 ENCOUNTER — Encounter: Payer: PRIVATE HEALTH INSURANCE | Attending: Family Medicine | Admitting: Dietician

## 2013-09-11 VITALS — Ht 68.5 in | Wt 226.2 lb

## 2013-09-11 DIAGNOSIS — E119 Type 2 diabetes mellitus without complications: Secondary | ICD-10-CM | POA: Diagnosis not present

## 2013-09-11 DIAGNOSIS — Z713 Dietary counseling and surveillance: Secondary | ICD-10-CM | POA: Diagnosis not present

## 2013-09-11 NOTE — Progress Notes (Signed)
  Medical Nutrition Therapy:  Appt start time: 0945 end time:  1030.   Assessment:  Primary concerns today:  Cole Ford reports that he was recently diagnosed with type 2 diabetes (April 2015). He was hospitalized with blood sugars above 500. Since starting his medication, his blood sugars are averaging about 210. He checks his BG in the morning (fasting), around noon after lunch, and before bed. He is eager to gain more understanding about diabetes and knowing what to eat. He lifts weights regularly and is used to eating a lot of protein. He has lost 10 pounds in the last 2 months by running, doing more cardio, and avoiding sweets. 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 Works M-F 8:30-5, 45 minute commute. He also has a 873-year old daughter.  Patient is interested in diabetes core classes.  Preferred Learning Style:   No preference indicated   Learning Readiness:   Ready  Change in progress   MEDICATIONS: see list (levemir)   DIETARY INTAKE:  24-hr recall:  B ( AM): egg whites or 2 full eggs and 1 bowl of oatmeal with brown sugar and light butter Snk ( AM): cheese, pretzels, carrots, and grapes  L ( PM): tunafish and 1/2 avocado Snk ( PM): wheat bread with pesto, spinach, and tomato D ( PM): salad or [employer] Snk ( PM): none  Beverages: water, Gatorade, almond milk  Usual physical activity: runs 4x a week for 45 minutes, weight lifting, zumba  Estimated energy needs: 2200-2400 calories 248-270 g carbohydrates (4 CHO choices at meals and 1-2 at snacks) 165-180 g protein 61-67 g fat  Progress Towards Goal(s):  In progress.   Nutritional Diagnosis:  [place]-2.2 Altered nutrition-related laboratory As related to family history of diabetes.  As evidenced by HgbA1c 8% and diabetes diagnosis.    Intervention:  Nutrition counseling provided. Described HgbA1c and metabolism of glucose. Educated patient on carbohydrate counting and patient verbalized  understanding.   Teaching Method Utilized:  Visual Auditory Hands on  Handouts given during visit include:  Living Well with Diabetes booklet  Carbohydrate counting (yellow card)  Barriers to learning/adherence to lifestyle change: none  Demonstrated degree of understanding via:  Teach Back   Monitoring/Evaluation:  Dietary intake, exercise, labs, and body weight prn.

## 2013-09-11 NOTE — Patient Instructions (Signed)
-   1 serving of carb = 15 grams of carbohydrate - Have 4 servings of carbs at meals and 1 serving at snacks  -Continue exercise regimen

## 2013-10-01 ENCOUNTER — Ambulatory Visit: Payer: PRIVATE HEALTH INSURANCE

## 2013-10-08 ENCOUNTER — Ambulatory Visit: Payer: PRIVATE HEALTH INSURANCE

## 2013-11-23 ENCOUNTER — Encounter (HOSPITAL_COMMUNITY): Payer: Self-pay | Admitting: Emergency Medicine

## 2013-11-23 ENCOUNTER — Emergency Department (HOSPITAL_COMMUNITY): Payer: PRIVATE HEALTH INSURANCE

## 2013-11-23 ENCOUNTER — Emergency Department (HOSPITAL_COMMUNITY)
Admission: EM | Admit: 2013-11-23 | Discharge: 2013-11-23 | Discharge status: Against Medical Advice | Payer: PRIVATE HEALTH INSURANCE | Attending: Emergency Medicine | Admitting: Emergency Medicine

## 2013-11-23 DIAGNOSIS — R61 Generalized hyperhidrosis: Secondary | ICD-10-CM | POA: Insufficient documentation

## 2013-11-23 DIAGNOSIS — E119 Type 2 diabetes mellitus without complications: Secondary | ICD-10-CM | POA: Insufficient documentation

## 2013-11-23 DIAGNOSIS — R42 Dizziness and giddiness: Secondary | ICD-10-CM | POA: Insufficient documentation

## 2013-11-23 DIAGNOSIS — R079 Chest pain, unspecified: Secondary | ICD-10-CM | POA: Insufficient documentation

## 2013-11-23 DIAGNOSIS — R0789 Other chest pain: Secondary | ICD-10-CM | POA: Insufficient documentation

## 2013-11-23 DIAGNOSIS — I10 Essential (primary) hypertension: Secondary | ICD-10-CM | POA: Insufficient documentation

## 2013-11-23 LAB — CBC
HCT: 41.9 % (ref 39.0–52.0)
Hemoglobin: 13.9 g/dL (ref 13.0–17.0)
MCH: 27 pg (ref 26.0–34.0)
MCHC: 33.2 g/dL (ref 30.0–36.0)
MCV: 81.5 fL (ref 78.0–100.0)
PLATELETS: 277 10*3/uL (ref 150–400)
RBC: 5.14 MIL/uL (ref 4.22–5.81)
RDW: 13 % (ref 11.5–15.5)
WBC: 6.3 10*3/uL (ref 4.0–10.5)

## 2013-11-23 LAB — BASIC METABOLIC PANEL
ANION GAP: 15 (ref 5–15)
BUN: 23 mg/dL (ref 6–23)
CALCIUM: 9.7 mg/dL (ref 8.4–10.5)
CO2: 23 meq/L (ref 19–32)
CREATININE: 0.91 mg/dL (ref 0.50–1.35)
Chloride: 95 mEq/L — ABNORMAL LOW (ref 96–112)
GFR calc non Af Amer: >90 mL/min (ref >90)
Glucose, Bld: 558 mg/dL (ref 70–99)
Potassium: 4.7 mEq/L (ref 3.7–5.3)
SODIUM: 133 meq/L — AB (ref 137–147)

## 2013-11-23 LAB — I-STAT TROPONIN, ED: TROPONIN I, POC: 0 ng/mL (ref 0.00–0.08)

## 2013-11-23 NOTE — ED Notes (Signed)
Pt reports 5/10 left sided "pressure, squeezing" chest pain with radiation to left jaw and left arm. Reports intermittent lightheadedness, diaphoresis. Hx: HTN, DM. AOx 4, NAD.

## 2014-06-24 ENCOUNTER — Encounter (HOSPITAL_COMMUNITY): Payer: Self-pay | Admitting: *Deleted

## 2014-06-24 ENCOUNTER — Emergency Department (HOSPITAL_COMMUNITY)
Admission: EM | Admit: 2014-06-24 | Discharge: 2014-06-25 | Disposition: A | Discharge status: Home / Self Care | Payer: PRIVATE HEALTH INSURANCE | Attending: Emergency Medicine | Admitting: Emergency Medicine

## 2014-06-24 DIAGNOSIS — E785 Hyperlipidemia, unspecified: Secondary | ICD-10-CM | POA: Insufficient documentation

## 2014-06-24 DIAGNOSIS — R143 Flatulence: Secondary | ICD-10-CM | POA: Insufficient documentation

## 2014-06-24 DIAGNOSIS — Z7982 Long term (current) use of aspirin: Secondary | ICD-10-CM | POA: Insufficient documentation

## 2014-06-24 DIAGNOSIS — IMO0001 Reserved for inherently not codable concepts without codable children: Secondary | ICD-10-CM

## 2014-06-24 DIAGNOSIS — I10 Essential (primary) hypertension: Secondary | ICD-10-CM | POA: Insufficient documentation

## 2014-06-24 DIAGNOSIS — Z862 Personal history of diseases of the blood and blood-forming organs and certain disorders involving the immune mechanism: Secondary | ICD-10-CM | POA: Insufficient documentation

## 2014-06-24 DIAGNOSIS — E780 Pure hypercholesterolemia: Secondary | ICD-10-CM | POA: Insufficient documentation

## 2014-06-24 DIAGNOSIS — Z794 Long term (current) use of insulin: Secondary | ICD-10-CM | POA: Insufficient documentation

## 2014-06-24 DIAGNOSIS — K5909 Other constipation: Secondary | ICD-10-CM | POA: Insufficient documentation

## 2014-06-24 DIAGNOSIS — K5904 Chronic idiopathic constipation: Secondary | ICD-10-CM

## 2014-06-24 DIAGNOSIS — Z79899 Other long term (current) drug therapy: Secondary | ICD-10-CM | POA: Insufficient documentation

## 2014-06-24 DIAGNOSIS — E119 Type 2 diabetes mellitus without complications: Secondary | ICD-10-CM | POA: Insufficient documentation

## 2014-06-24 DIAGNOSIS — R52 Pain, unspecified: Secondary | ICD-10-CM

## 2014-06-24 LAB — URINALYSIS, ROUTINE W REFLEX MICROSCOPIC
Bilirubin Urine: NEGATIVE
Glucose, UA: >1000 mg/dL — AB
Hgb urine dipstick: NEGATIVE
Ketones, ur: NEGATIVE mg/dL
Leukocytes, UA: NEGATIVE
NITRITE: NEGATIVE
Protein, ur: NEGATIVE mg/dL
SPECIFIC GRAVITY, URINE: 1.031 — AB (ref 1.005–1.030)
Urobilinogen, UA: 0.2 mg/dL (ref 0.0–1.0)
pH: 5.5 (ref 5.0–8.0)

## 2014-06-24 LAB — COMPREHENSIVE METABOLIC PANEL
ALK PHOS: 76 U/L (ref 39–117)
ALT: 25 U/L (ref 0–53)
ANION GAP: 12 (ref 5–15)
AST: 25 U/L (ref 0–37)
Albumin: 4.3 g/dL (ref 3.5–5.2)
BILIRUBIN TOTAL: 0.6 mg/dL (ref 0.3–1.2)
BUN: 16 mg/dL (ref 6–23)
CO2: 23 mmol/L (ref 19–32)
Calcium: 9.4 mg/dL (ref 8.4–10.5)
Chloride: 104 mmol/L (ref 96–112)
Creatinine, Ser: 1.07 mg/dL (ref 0.50–1.35)
GFR calc Af Amer: >90 mL/min (ref >90)
GFR, EST NON AFRICAN AMERICAN: 87 mL/min — AB (ref >90)
GLUCOSE: 216 mg/dL — AB (ref 70–99)
POTASSIUM: 4.1 mmol/L (ref 3.5–5.1)
Sodium: 139 mmol/L (ref 135–145)
Total Protein: 7.6 g/dL (ref 6.0–8.3)

## 2014-06-24 LAB — URINE MICROSCOPIC-ADD ON

## 2014-06-24 LAB — CBC WITH DIFFERENTIAL/PLATELET
Basophils Absolute: 0 10*3/uL (ref 0.0–0.1)
Basophils Relative: 0 % (ref 0–1)
Eosinophils Absolute: 0.1 10*3/uL (ref 0.0–0.7)
Eosinophils Relative: 2 % (ref 0–5)
HCT: 42.2 % (ref 39.0–52.0)
Hemoglobin: 14.1 g/dL (ref 13.0–17.0)
Lymphocytes Relative: 39 % (ref 12–46)
Lymphs Abs: 3.4 10*3/uL (ref 0.7–4.0)
MCH: 28 pg (ref 26.0–34.0)
MCHC: 33.4 g/dL (ref 30.0–36.0)
MCV: 83.9 fL (ref 78.0–100.0)
Monocytes Absolute: 0.4 10*3/uL (ref 0.1–1.0)
Monocytes Relative: 5 % (ref 3–12)
NEUTROS PCT: 54 % (ref 43–77)
Neutro Abs: 4.7 10*3/uL (ref 1.7–7.7)
PLATELETS: 288 10*3/uL (ref 150–400)
RBC: 5.03 MIL/uL (ref 4.22–5.81)
RDW: 14 % (ref 11.5–15.5)
WBC: 8.6 10*3/uL (ref 4.0–10.5)

## 2014-06-24 LAB — LIPASE, BLOOD: LIPASE: 26 U/L (ref 11–59)

## 2014-06-24 MED ORDER — GI COCKTAIL ~~LOC~~
30.0000 mL | Freq: Once | ORAL | Status: AC
  Administered 2014-06-25: 30 mL via ORAL
  Filled 2014-06-24: qty 30

## 2014-06-24 MED ORDER — DICYCLOMINE HCL 10 MG/ML IM SOLN
20.0000 mg | Freq: Once | INTRAMUSCULAR | Status: AC
  Administered 2014-06-25: 20 mg via INTRAMUSCULAR
  Filled 2014-06-24: qty 2

## 2014-06-24 NOTE — ED Notes (Signed)
MD at bedside. 

## 2014-06-24 NOTE — ED Notes (Signed)
Pt reports LUQ pain radiating to his back x 3 days.  Pt reports feeling bloated.  Denies n/v/d at this time.  Denies any urinary sxs at this time.

## 2014-06-25 ENCOUNTER — Emergency Department (HOSPITAL_COMMUNITY): Payer: PRIVATE HEALTH INSURANCE

## 2014-06-25 ENCOUNTER — Encounter (HOSPITAL_COMMUNITY): Payer: Self-pay | Admitting: Emergency Medicine

## 2014-06-25 MED ORDER — POLYETHYLENE GLYCOL 3350 17 GM/SCOOP PO POWD: 17.0000 g | Freq: Every day | ORAL | Status: DC

## 2014-06-25 MED ORDER — KETOROLAC TROMETHAMINE 30 MG/ML IJ SOLN
30.0000 mg | Freq: Once | INTRAMUSCULAR | Status: AC
  Administered 2014-06-25: 30 mg via INTRAVENOUS
  Filled 2014-06-25: qty 1

## 2014-06-25 NOTE — ED Provider Notes (Signed)
CSN: [id number]     Arrival date & time 06/24/14  2110 History   First MD Initiated Contact with Patient 06/24/14 2305     Chief Complaint  Patient presents with  . Abdominal Pain     (Consider location/radiation/quality/duration/timing/severity/associated sxs/prior Treatment) Patient is a 39 y.o. male presenting with abdominal pain. The history is provided by the patient.  Abdominal Pain Pain location:  LUQ Pain quality: cramping   Pain radiates to:  Does not radiate Pain severity:  Moderate Onset quality:  Gradual Timing:  Constant Progression:  Unchanged Chronicity:  New Context: not alcohol use, not retching and not trauma   Relieved by:  Nothing Worsened by:  Nothing tried Ineffective treatments:  None tried Associated symptoms: no anorexia, no diarrhea, no dysuria, no shortness of breath and no vomiting   Risk factors: has not had multiple surgeries     Past Medical History  Diagnosis Date  . Hypertension   . Immune deficiency disorder   . Diabetes mellitus without complication   . Hypercholesteremia   . Hyperlipidemia 01/12/2012   History reviewed. No pertinent past surgical history. Family History  Problem Relation Age of Onset  . Hypertension Mother   . Kidney failure Mother   . Diabetes Father   . Diabetes Maternal Grandmother   . Diabetes Maternal Grandfather    History  Substance Use Topics  . Smoking status: Never Smoker   . Smokeless tobacco: Not on file  . Alcohol Use: No    Review of Systems  Respiratory: Negative for shortness of breath.   Gastrointestinal: Positive for abdominal pain. Negative for vomiting, diarrhea and anorexia.  Genitourinary: Negative for dysuria.  All other systems reviewed and are negative.     Allergies  Metformin and related  Home Medications   Prior to Admission medications   Medication Sig Start Date End Date Taking? Authorizing Provider  aspirin EC 81 MG tablet Take 81 mg by mouth daily.   Yes Historical  Provider, MD  atorvastatin (LIPITOR) 10 MG tablet Take 10 mg by mouth daily.   Yes Historical Provider, MD  hydrochlorothiazide (MICROZIDE) 12.5 MG capsule Take 12.5 mg by mouth daily.   Yes Historical Provider, MD  insulin detemir (LEVEMIR) 100 UNIT/ML injection Inject 22 Units into the skin at bedtime.   Yes Historical Provider, MD  insulin lispro (HUMALOG) 100 UNIT/ML injection Inject 17 Units into the skin 3 (three) times daily before meals.   Yes Historical Provider, MD  Omega-3 Fatty Acids (FISH OIL PO) Take 2 capsules by mouth daily.    Yes Historical Provider, MD   BP 138/70 mmHg  Pulse 58  Temp(Src) 98.4 F (36.9 C) (Oral)  Resp 17  SpO2 98% Physical Exam  Constitutional: He is oriented to person, place, and time. He appears well-developed and well-nourished. No distress.  HENT:  Head: Normocephalic and atraumatic.  Mouth/Throat: Oropharynx is clear and moist.  Eyes: Conjunctivae are normal. Pupils are equal, round, and reactive to light.  Neck: Normal range of motion. Neck supple.  Cardiovascular: Normal rate, regular rhythm and intact distal pulses.   Pulmonary/Chest: Effort normal and breath sounds normal. No respiratory distress. He has no wheezes. He has no rales.  Abdominal: Soft. Bowel sounds are increased. There is no tenderness. There is no rigidity, no rebound, no guarding, no tenderness at McBurney's point and negative Murphy's sign.  Gassy most prominent in the LUQ  Musculoskeletal: Normal range of motion.  Neurological: He is alert and oriented to person, place, and  time.  Skin: Skin is warm and dry.  Psychiatric: He has a normal mood and affect.    ED Course  Procedures (including critical care time) Labs Review Labs Reviewed  COMPREHENSIVE METABOLIC PANEL - Abnormal; Notable for the following:    Glucose, Bld 216 (*)    GFR calc non Af Amer 87 (*)    All other components within normal limits  URINALYSIS, ROUTINE W REFLEX MICROSCOPIC - Abnormal; Notable  for the following:    Specific Gravity, Urine 1.031 (*)    Glucose, UA >1000 (*)    All other components within normal limits  CBC WITH DIFFERENTIAL/PLATELET  LIPASE, BLOOD  URINE MICROSCOPIC-ADD ON    Imaging Review Dg Abd Acute W/chest  06/25/2014   CLINICAL DATA:  Left upper quadrant abdominal pain radiating to the back for 3 days.  EXAM: ACUTE ABDOMEN SERIES (ABDOMEN 2 VIEW & CHEST 1 VIEW)  COMPARISON:  Chest radiograph 11/23/2013  FINDINGS: The cardiomediastinal contours are normal. The lungs are clear. There is no free intra-abdominal air. No dilated bowel loops to suggest obstruction. Small to moderate volume of stool throughout the colon. No radiopaque calculi. There is pelvic phleboliths on the left. No acute osseous abnormalities are seen. Incidental note of 6 non-rib-bearing lumbar vertebra.  IMPRESSION: No bowel obstruction or free air. Small to moderate volume of colonic stool.   Electronically Signed   By: Rubye Oaks M.D.   On: 06/25/2014 01:13     EKG Interpretation None      MDM   Final diagnoses:  Pain    Gas and constipation.  Will treat with miralax.  Strict return precautions given   Hamza Empson, MD 06/25/14 [id number]

## 2015-09-30 IMAGING — CR DG ABDOMEN ACUTE W/ 1V CHEST
4 series · 4 of 4 positions shown · non-contrast
Comparison: Chest radiograph 11/23/2013

CLINICAL DATA: Left upper quadrant abdominal pain radiating to the
back for 3 days.

EXAM:
ACUTE ABDOMEN SERIES (ABDOMEN 2 VIEW & CHEST 1 VIEW)

[w chest pa]
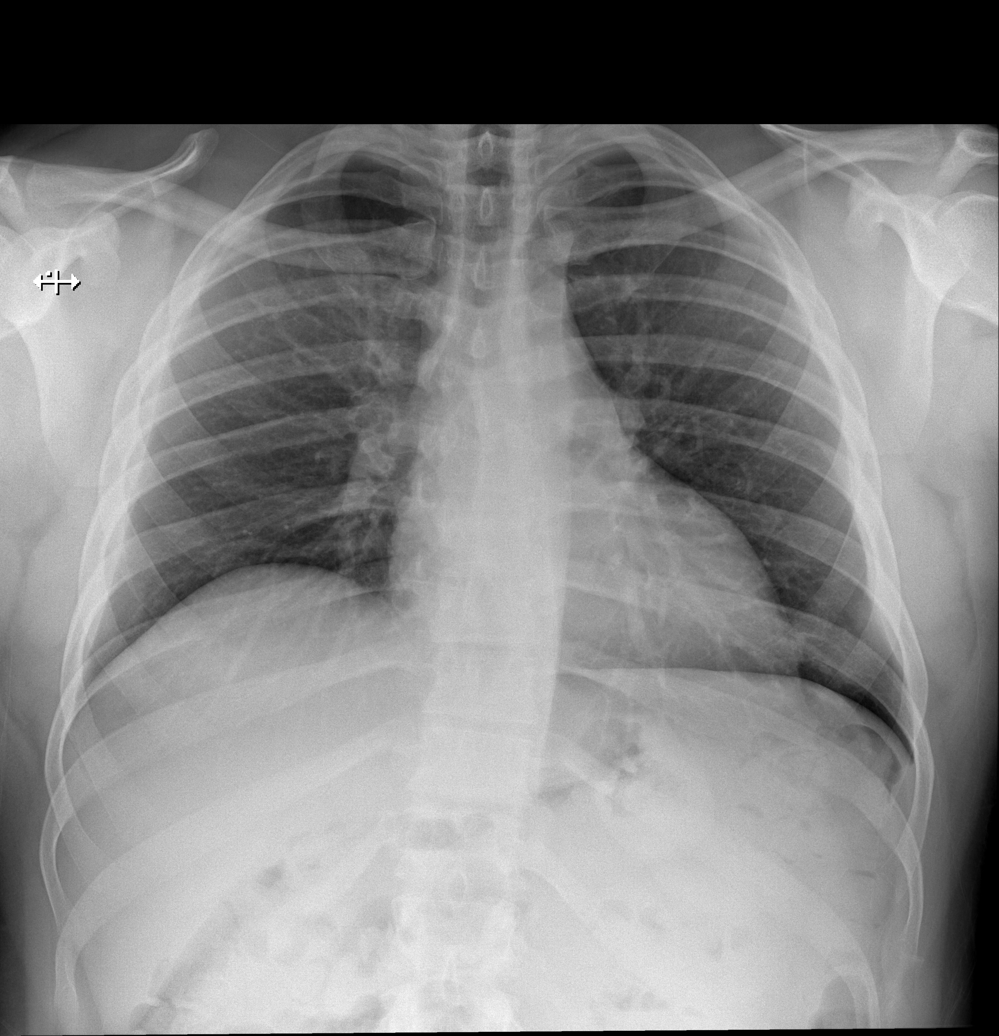

[w abdomen upright]
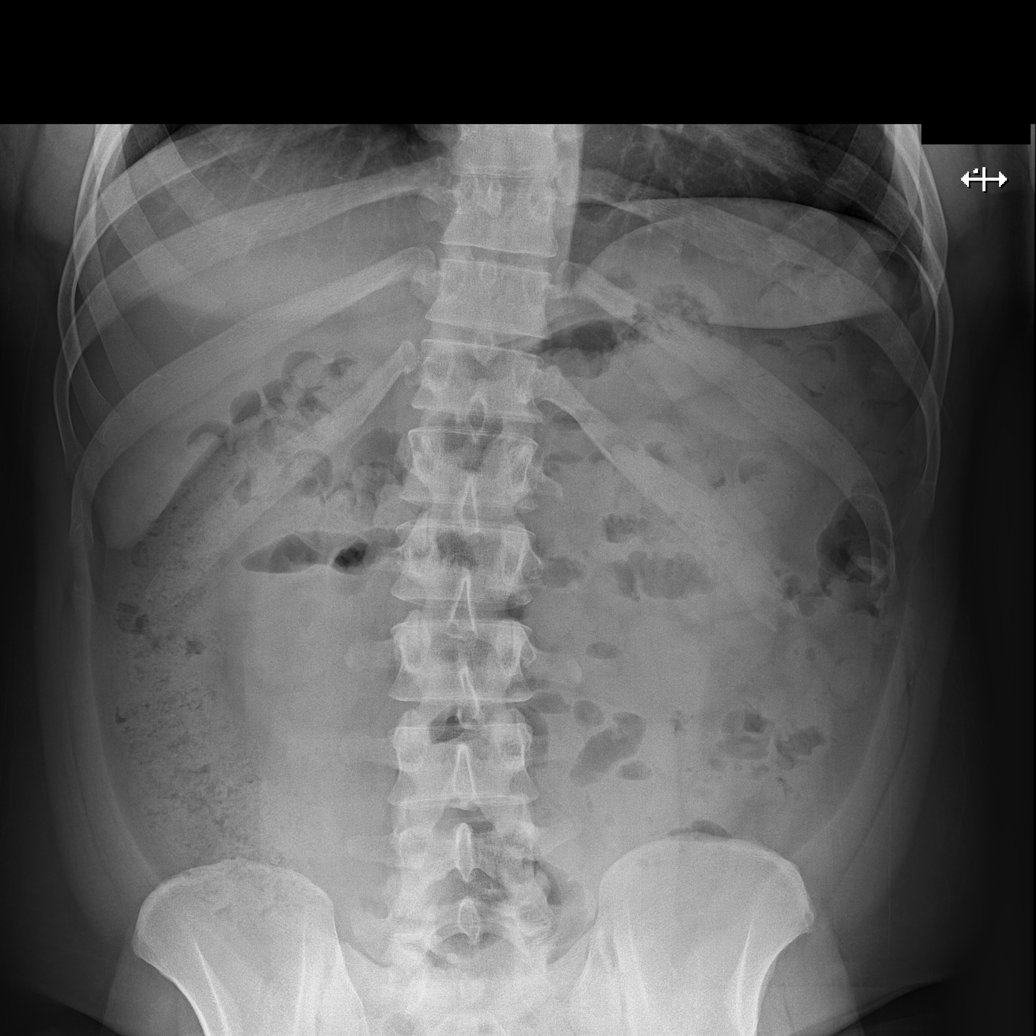

[t abdomen supine (1 of 2)]
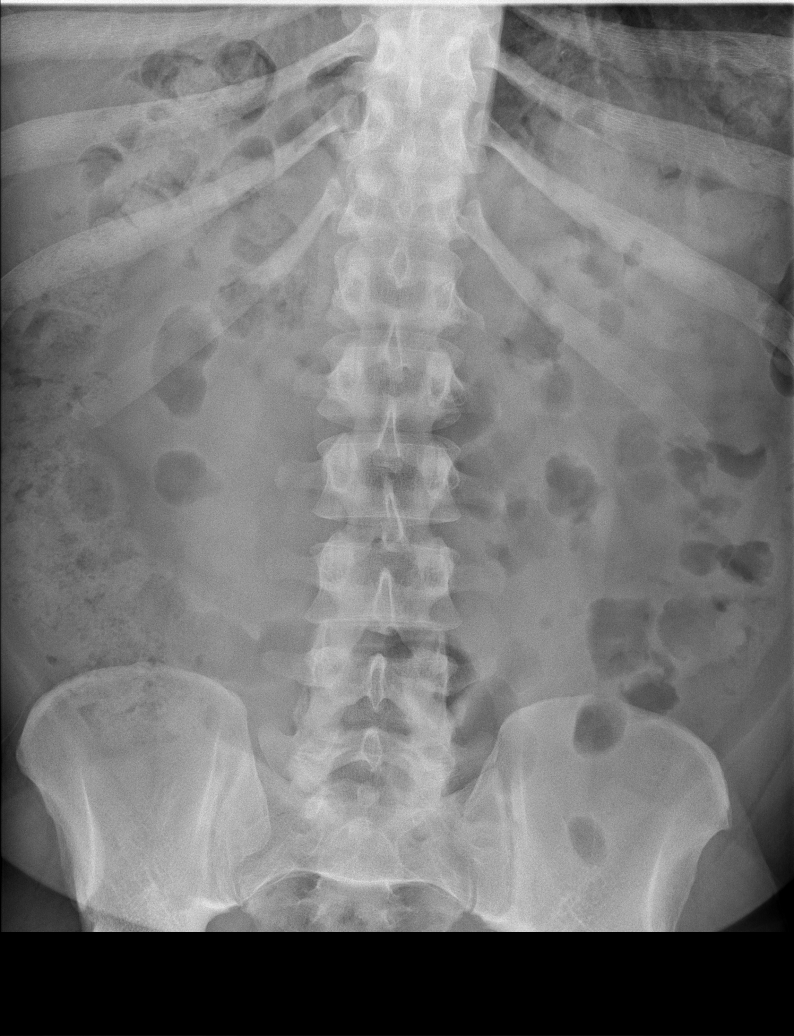

[t abdomen supine (2 of 2)]
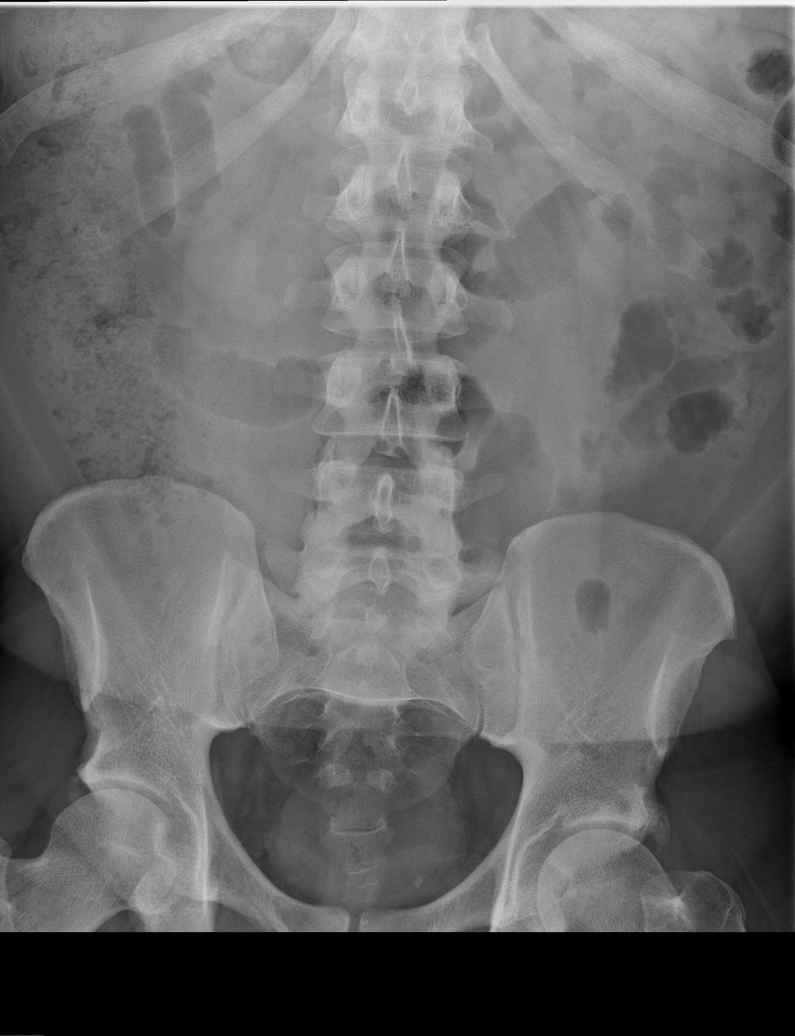

[4 of 4 positions shown; findings below may reference images not displayed]

FINDINGS: The cardiomediastinal contours are normal. The lungs are clear.
There is no free intra-abdominal air. No dilated bowel loops to
suggest obstruction. Small to moderate volume of stool throughout
the colon. No radiopaque calculi. There is pelvic phleboliths on the
left. No acute osseous abnormalities are seen. Incidental note of 6
non-rib-bearing lumbar vertebra.
IMPRESSION: No bowel obstruction or free air. Small to moderate volume of
colonic stool.

## 2015-12-30 ENCOUNTER — Encounter (HOSPITAL_COMMUNITY): Payer: Self-pay | Admitting: Emergency Medicine

## 2015-12-30 ENCOUNTER — Ambulatory Visit (HOSPITAL_COMMUNITY)
Admission: EM | Admit: 2015-12-30 | Discharge: 2015-12-30 | Disposition: A | Discharge status: Home / Self Care | Payer: BLUE CROSS/BLUE SHIELD | Attending: Family Medicine | Admitting: Family Medicine

## 2015-12-30 DIAGNOSIS — R1012 Left upper quadrant pain: Secondary | ICD-10-CM

## 2015-12-30 NOTE — ED Provider Notes (Signed)
CSN: [id number]     Arrival date & time 12/30/15  1400 History   None    Chief Complaint  Patient presents with  . Abdominal Pain   (Consider location/radiation/quality/duration/timing/severity/associated sxs/prior Treatment) Pt reports 2 wk h/o intermittent LUQ abd pain that at times radiates to the RUQ. Some associated mild intermittent nausea but denies fever, diarrhea or other symptoms. States has been on Invokamet x 1 year and has not had problems but when her recently ran out of the med and was off of it x 1-1.5 weeks he noted that his symptoms resolved then returned when he restarted the medication.   The history is provided by the patient.  Abdominal Pain  Pain location:  LUQ Pain quality: sharp   Pain radiates to:  RUQ Pain severity:  Severe Onset quality:  Gradual Duration:  2 weeks Timing:  Intermittent Progression:  Unchanged Chronicity:  New Context: awakening from sleep   Context: not alcohol use, not diet changes, not eating, not laxative use, not medication withdrawal, not previous surgeries, not recent illness, not recent travel, not retching, not sick contacts, not suspicious food intake and not trauma   Relieved by:  Nothing Worsened by:  Nothing Ineffective treatments:  None tried Associated symptoms: nausea   Associated symptoms: no constipation, no diarrhea, no fatigue, no fever and no vomiting   Risk factors: no alcohol abuse, no aspirin use, has not had multiple surgeries, no NSAID use and not obese     Past Medical History:  Diagnosis Date  . Diabetes mellitus without complication (HCC)   . Hypercholesteremia   . Hyperlipidemia 01/12/2012  . Hypertension   . Immune deficiency disorder (HCC)    History reviewed. No pertinent surgical history. Family History  Problem Relation Age of Onset  . Hypertension Mother   . Kidney failure Mother   . Diabetes Father   . Diabetes Maternal Grandmother   . Diabetes Maternal Grandfather    Social History   Substance Use Topics  . Smoking status: Never Smoker  . Smokeless tobacco: Never Used  . Alcohol use No    Review of Systems  Constitutional: Negative for fatigue and fever.  Gastrointestinal: Positive for abdominal pain and nausea. Negative for constipation, diarrhea and vomiting.  All other systems reviewed and are negative.   Allergies  Metformin and related  Home Medications   Prior to Admission medications   Medication Sig Start Date End Date Taking? Authorizing Provider  atorvastatin (LIPITOR) 10 MG tablet Take 10 mg by mouth daily.   Yes Historical Provider, MD  Canagliflozin-Metformin HCl (INVOKAMET PO) Take by mouth.   Yes Historical Provider, MD  losartan (COZAAR) 25 MG tablet Take 25 mg by mouth daily.   Yes Historical Provider, MD  Omega-3 Fatty Acids (FISH OIL PO) Take 2 capsules by mouth daily.    Yes Historical Provider, MD  aspirin EC 81 MG tablet Take 81 mg by mouth daily.    Historical Provider, MD  hydrochlorothiazide (MICROZIDE) 12.5 MG capsule Take 12.5 mg by mouth daily.    Historical Provider, MD  insulin detemir (LEVEMIR) 100 UNIT/ML injection Inject 22 Units into the skin at bedtime.    Historical Provider, MD  insulin lispro (HUMALOG) 100 UNIT/ML injection Inject 17 Units into the skin 3 (three) times daily before meals.    Historical Provider, MD  polyethylene glycol powder (MIRALAX) powder Take 17 g by mouth daily. 06/25/14   April Palumbo, MD   Meds Ordered and Administered this Visit  Medications -  No data to display  BP 115/65 (BP Location: Left Arm)   Pulse 65   Temp 98 F (36.7 C) (Oral)   Resp 12   SpO2 98%  No data found.   Physical Exam  Constitutional: He is oriented to person, place, and time. He appears well-developed and well-nourished.  HENT:  Head: Normocephalic and atraumatic.  Eyes: Conjunctivae are normal.  Cardiovascular: Normal rate.   Pulmonary/Chest: Effort normal.  Abdominal: Soft. Bowel sounds are normal. There is  tenderness in the left upper quadrant.  Mild TTP LUQ  Neurological: He is alert and oriented to person, place, and time.  Skin: Skin is warm and dry.    Urgent Care Course   Clinical Course    Procedures (including critical care time)  Labs Review Labs Reviewed - No data to display  Imaging Review No results found.   Visual Acuity Review  Right Eye Distance:   Left Eye Distance:   Bilateral Distance:    Right Eye Near:   Left Eye Near:    Bilateral Near:         MDM   1. Left upper quadrant pain   Exam today completely normal except for mild LUQ TTP. Given HPI suspect intermittent symptoms related to pt's medication (Invokamet). Reassured pt no indication of acute abd process. Encouraged to continue medication but to arrange f/u w/ PCP to discuss symptoms as soon as possible. Pt seemed very reassured and is aggreable w/ plan. Discussed pt w/ Dr Dayton Scrape who is also in aggreement w/ plan to f/u w/ PCP.     Leanne Chang, NP 12/30/15 1555

## 2015-12-30 NOTE — ED Triage Notes (Signed)
Pt here for constant LUQ pain onset x3 weeks associated w/intermittent nausea  LBM = yest night   A&O x4... NAD

## 2015-12-30 NOTE — Discharge Instructions (Signed)
Your abdominal exam and vital signs today are normal except for mild tenderness to the left upper abdomen. In the absence of fever, vomiting, diarrhea or other acute type symptoms and due to information that the pain resolved when you were not taking the Invokamet I suspect your medication is related to your symptoms. Please continue to medication but arrange follow up with Dr Cheri GuppyMurrow to be seen as soon as possible for further evaluation. Certainly if your symptoms worsen or other acute type symptoms appear please return to the Emergency Department as soon as possible.

## 2018-09-08 ENCOUNTER — Other Ambulatory Visit: Payer: Self-pay | Admitting: Family Medicine

## 2018-09-08 DIAGNOSIS — R1011 Right upper quadrant pain: Secondary | ICD-10-CM

## 2018-09-26 ENCOUNTER — Telehealth: Payer: Self-pay

## 2018-09-26 ENCOUNTER — Other Ambulatory Visit: Payer: BLUE CROSS/BLUE SHIELD

## 2018-09-26 DIAGNOSIS — Z20822 Contact with and (suspected) exposure to covid-19: Secondary | ICD-10-CM

## 2018-09-26 NOTE — Telephone Encounter (Signed)
rec'd call from Dr. Murvin Natal office with request to schedule COVID testing for poss. Exposure.  Pt. Was contacted to schedule for COVID testing.  Appt. Scheduled at [facility], today at 1:30 PM.  Advised to wear mask, and remain in car for testing.  Verb. Understanding.

## 2018-10-02 LAB — NOVEL CORONAVIRUS, NAA: SARS-CoV-2, NAA: NOT DETECTED

## 2018-12-12 DIAGNOSIS — E782 Mixed hyperlipidemia: Secondary | ICD-10-CM | POA: Diagnosis not present

## 2018-12-12 DIAGNOSIS — I10 Essential (primary) hypertension: Secondary | ICD-10-CM | POA: Diagnosis not present

## 2018-12-12 DIAGNOSIS — E119 Type 2 diabetes mellitus without complications: Secondary | ICD-10-CM | POA: Diagnosis not present

## 2018-12-12 DIAGNOSIS — N529 Male erectile dysfunction, unspecified: Secondary | ICD-10-CM | POA: Diagnosis not present

## 2019-03-06 DIAGNOSIS — E782 Mixed hyperlipidemia: Secondary | ICD-10-CM | POA: Diagnosis not present

## 2019-03-06 DIAGNOSIS — M179 Osteoarthritis of knee, unspecified: Secondary | ICD-10-CM | POA: Diagnosis not present

## 2019-03-06 DIAGNOSIS — K047 Periapical abscess without sinus: Secondary | ICD-10-CM | POA: Diagnosis not present

## 2019-03-06 DIAGNOSIS — E119 Type 2 diabetes mellitus without complications: Secondary | ICD-10-CM | POA: Diagnosis not present

## 2019-06-22 ENCOUNTER — Other Ambulatory Visit: Payer: Self-pay | Admitting: Family Medicine

## 2019-06-22 DIAGNOSIS — R109 Unspecified abdominal pain: Secondary | ICD-10-CM

## 2019-06-22 DIAGNOSIS — R1010 Upper abdominal pain, unspecified: Secondary | ICD-10-CM | POA: Diagnosis not present

## 2019-06-22 DIAGNOSIS — E119 Type 2 diabetes mellitus without complications: Secondary | ICD-10-CM | POA: Diagnosis not present

## 2019-06-22 DIAGNOSIS — I10 Essential (primary) hypertension: Secondary | ICD-10-CM | POA: Diagnosis not present

## 2019-06-22 DIAGNOSIS — M7542 Impingement syndrome of left shoulder: Secondary | ICD-10-CM | POA: Diagnosis not present

## 2019-06-23 ENCOUNTER — Ambulatory Visit
Admission: RE | Admit: 2019-06-23 | Discharge: 2019-06-23 | Disposition: A | Discharge status: Home / Self Care | Payer: Medicaid Other | Source: Ambulatory Visit | Attending: Family Medicine | Admitting: Family Medicine

## 2019-06-23 DIAGNOSIS — R109 Unspecified abdominal pain: Secondary | ICD-10-CM | POA: Diagnosis not present

## 2019-08-13 DIAGNOSIS — R1010 Upper abdominal pain, unspecified: Secondary | ICD-10-CM | POA: Diagnosis not present

## 2019-08-13 DIAGNOSIS — I10 Essential (primary) hypertension: Secondary | ICD-10-CM | POA: Diagnosis not present

## 2019-08-13 DIAGNOSIS — E119 Type 2 diabetes mellitus without complications: Secondary | ICD-10-CM | POA: Diagnosis not present

## 2019-08-13 DIAGNOSIS — E782 Mixed hyperlipidemia: Secondary | ICD-10-CM | POA: Diagnosis not present

## 2019-09-18 DIAGNOSIS — E782 Mixed hyperlipidemia: Secondary | ICD-10-CM | POA: Diagnosis not present

## 2019-09-18 DIAGNOSIS — I152 Hypertension secondary to endocrine disorders: Secondary | ICD-10-CM | POA: Diagnosis not present

## 2019-09-18 DIAGNOSIS — E1165 Type 2 diabetes mellitus with hyperglycemia: Secondary | ICD-10-CM | POA: Diagnosis not present

## 2019-10-02 DIAGNOSIS — L0390 Cellulitis, unspecified: Secondary | ICD-10-CM | POA: Diagnosis not present

## 2019-10-02 DIAGNOSIS — E1165 Type 2 diabetes mellitus with hyperglycemia: Secondary | ICD-10-CM | POA: Diagnosis not present

## 2019-10-26 DIAGNOSIS — Z3009 Encounter for other general counseling and advice on contraception: Secondary | ICD-10-CM | POA: Diagnosis not present

## 2019-11-30 DIAGNOSIS — I10 Essential (primary) hypertension: Secondary | ICD-10-CM | POA: Diagnosis not present

## 2019-11-30 DIAGNOSIS — E1165 Type 2 diabetes mellitus with hyperglycemia: Secondary | ICD-10-CM | POA: Diagnosis not present

## 2019-11-30 DIAGNOSIS — E782 Mixed hyperlipidemia: Secondary | ICD-10-CM | POA: Diagnosis not present

## 2019-11-30 DIAGNOSIS — Z302 Encounter for sterilization: Secondary | ICD-10-CM | POA: Diagnosis not present

## 2019-12-01 DIAGNOSIS — I10 Essential (primary) hypertension: Secondary | ICD-10-CM | POA: Diagnosis not present

## 2019-12-01 DIAGNOSIS — E1165 Type 2 diabetes mellitus with hyperglycemia: Secondary | ICD-10-CM | POA: Diagnosis not present

## 2019-12-01 DIAGNOSIS — E782 Mixed hyperlipidemia: Secondary | ICD-10-CM | POA: Diagnosis not present

## 2020-05-16 DIAGNOSIS — K625 Hemorrhage of anus and rectum: Secondary | ICD-10-CM | POA: Diagnosis not present

## 2020-05-17 DIAGNOSIS — N529 Male erectile dysfunction, unspecified: Secondary | ICD-10-CM | POA: Diagnosis not present

## 2020-05-17 DIAGNOSIS — R109 Unspecified abdominal pain: Secondary | ICD-10-CM | POA: Diagnosis not present

## 2020-05-17 DIAGNOSIS — K649 Unspecified hemorrhoids: Secondary | ICD-10-CM | POA: Diagnosis not present

## 2020-05-24 DIAGNOSIS — E782 Mixed hyperlipidemia: Secondary | ICD-10-CM | POA: Diagnosis not present

## 2020-05-24 DIAGNOSIS — I10 Essential (primary) hypertension: Secondary | ICD-10-CM | POA: Diagnosis not present

## 2020-05-24 DIAGNOSIS — E1165 Type 2 diabetes mellitus with hyperglycemia: Secondary | ICD-10-CM | POA: Diagnosis not present

## 2020-05-27 DIAGNOSIS — K649 Unspecified hemorrhoids: Secondary | ICD-10-CM | POA: Diagnosis not present

## 2020-05-27 DIAGNOSIS — E1165 Type 2 diabetes mellitus with hyperglycemia: Secondary | ICD-10-CM | POA: Diagnosis not present

## 2020-05-27 DIAGNOSIS — I152 Hypertension secondary to endocrine disorders: Secondary | ICD-10-CM | POA: Diagnosis not present

## 2020-05-27 DIAGNOSIS — E1159 Type 2 diabetes mellitus with other circulatory complications: Secondary | ICD-10-CM | POA: Diagnosis not present

## 2020-09-27 IMAGING — US US ABDOMEN COMPLETE
1 series · 14 of 25 positions shown · non-contrast
Comparison: None.

CLINICAL DATA: Abdominal pain with unspecified location

EXAM:
ABDOMEN ULTRASOUND COMPLETE

[Series 1: us abdomen complete · 0.19mm/px · 14 of 80 slices shown]
[im 1/80]
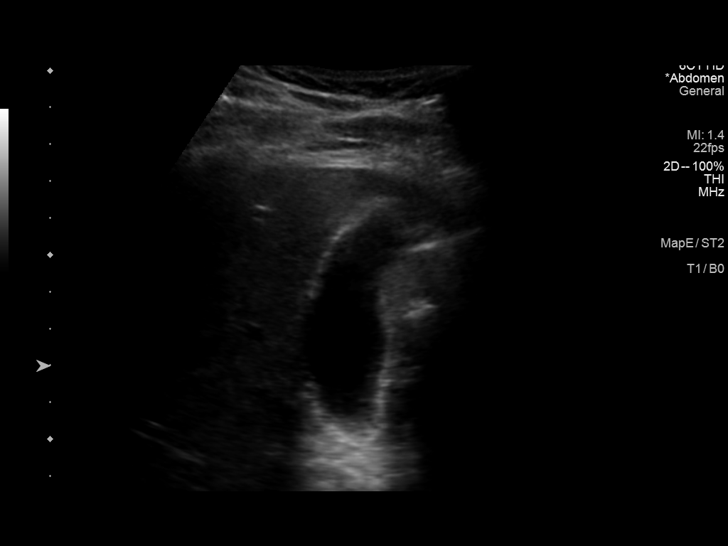
[im 7/80]
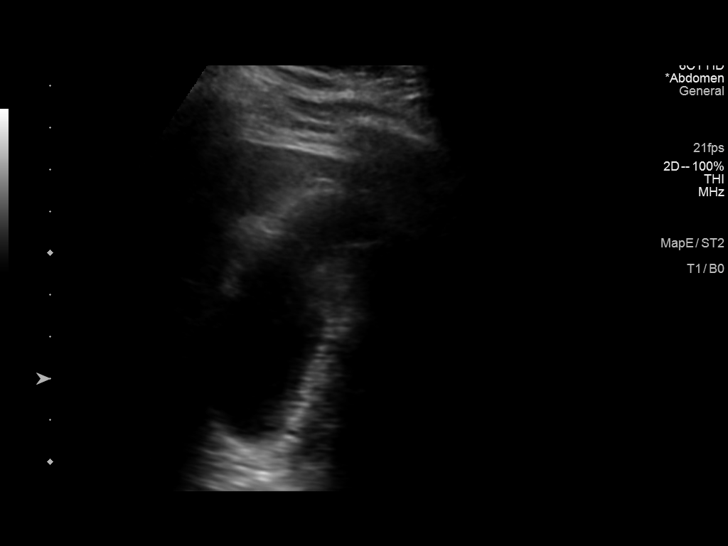
[im 14/80]
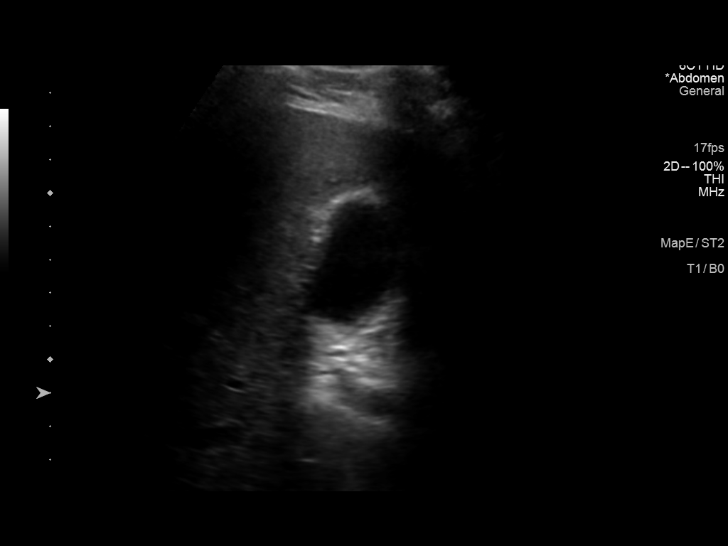
[im 20/80]
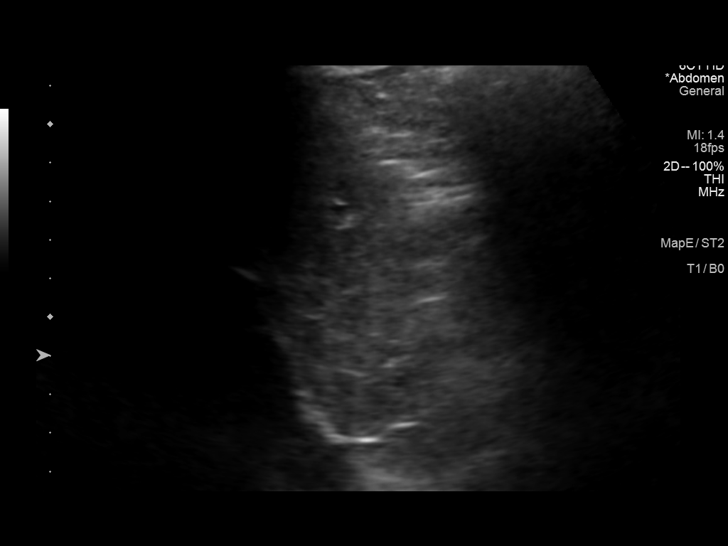
[im 27/80]
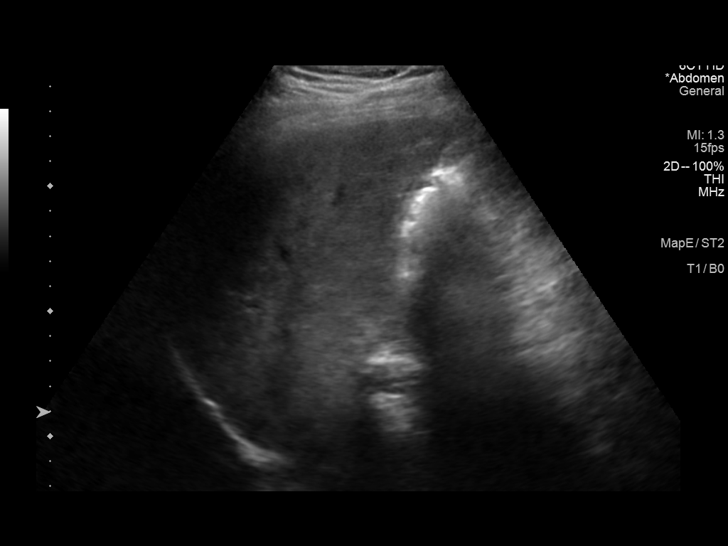
[im 30/80]
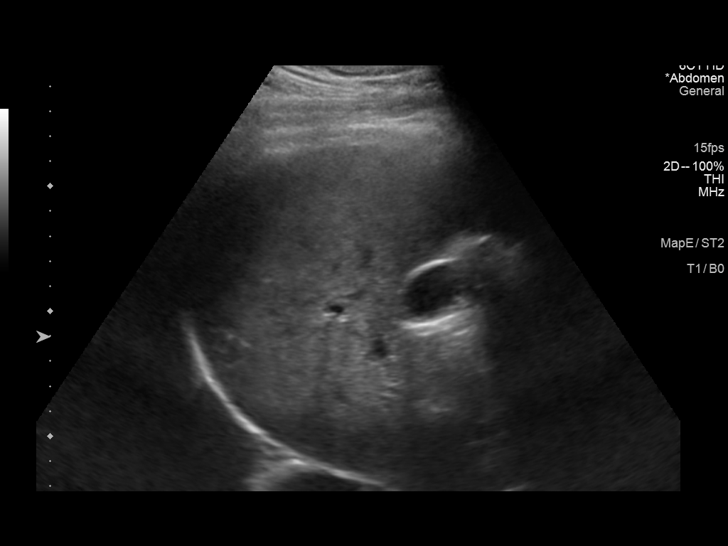
[im 37/80]
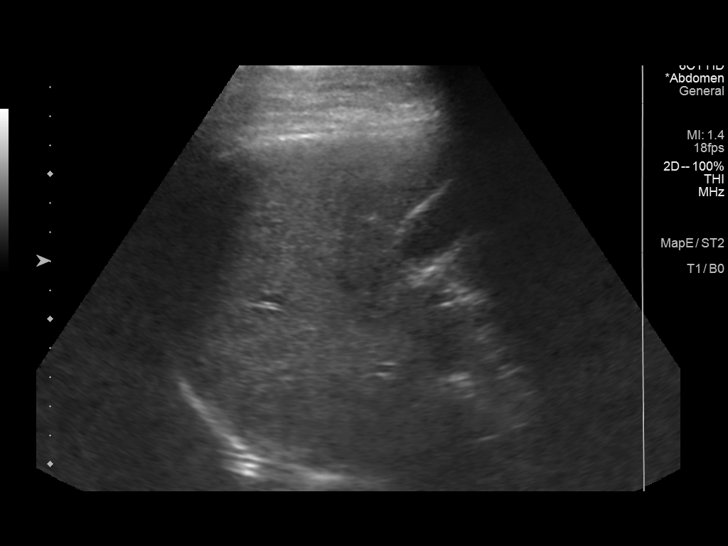
[im 43/80]
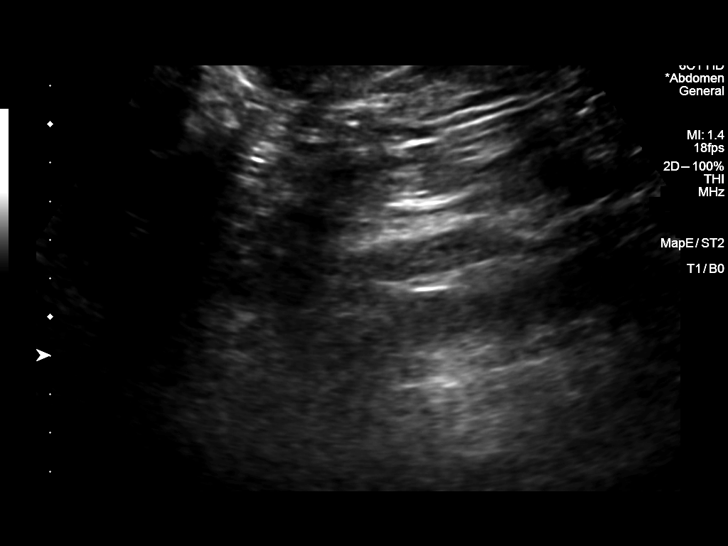
[im 50/80]
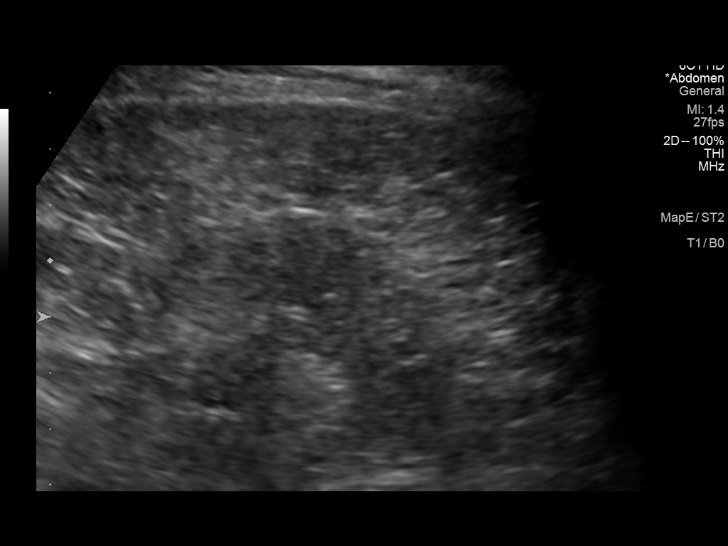
[im 53/80]
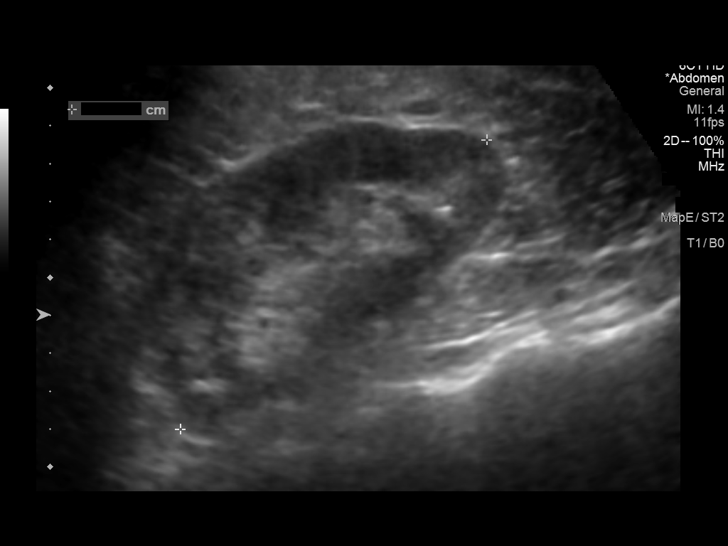
[im 60/80]
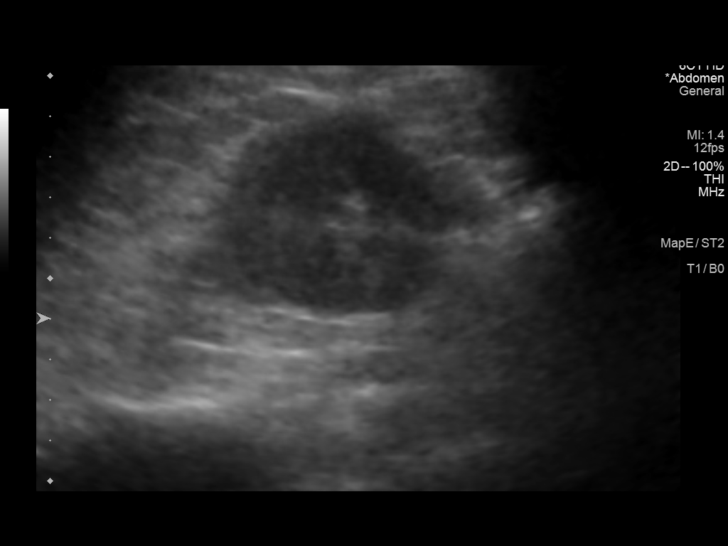
[im 66/80]
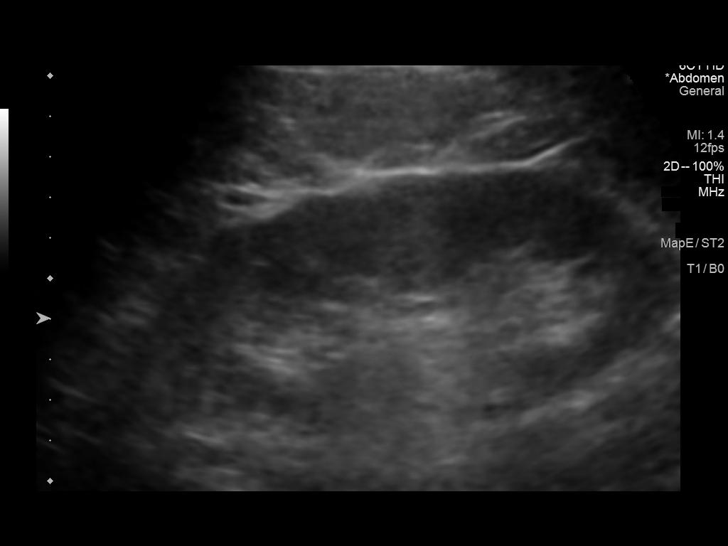
[im 73/80]
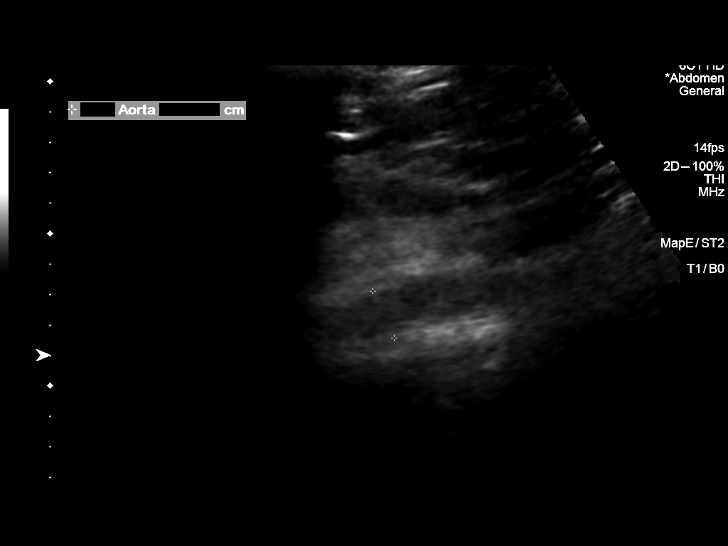
[im 80/80]
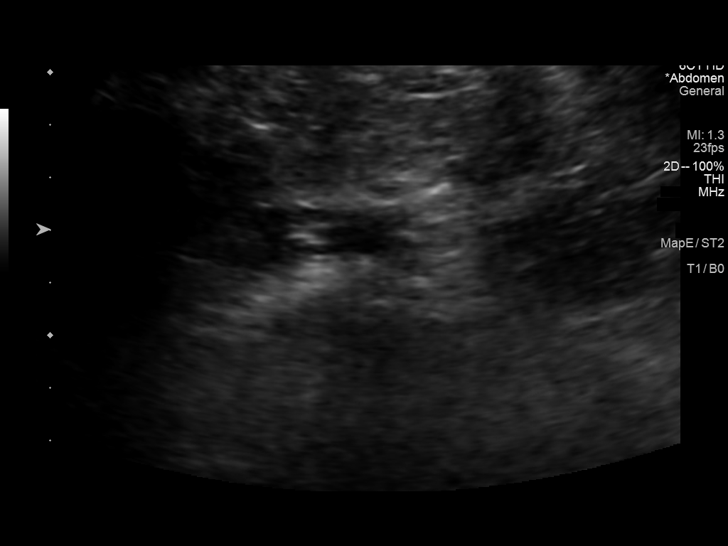

[14 of 25 positions shown; findings below may reference images not displayed]

FINDINGS: Gallbladder: No gallstones or wall thickening visualized. No
sonographic Murphy sign noted by sonographer.

Common bile duct: Diameter: 4 mm

Liver: No focal lesion identified. Within normal limits in
parenchymal echogenicity. Portal vein is patent on color Doppler
imaging with normal direction of blood flow towards the liver.

IVC: Limited visualization

Pancreas: Visualized portion unremarkable.

Spleen: 3.5 cm.  No focal abnormality

Right Kidney: Length: 11 cm. Echogenicity within normal limits. No
mass or hydronephrosis visualized.

Left Kidney: Length: 12 cm. Echogenicity within normal limits. No
mass or hydronephrosis visualized.

Abdominal aorta: No aneurysm visualized.
IMPRESSION: Negative abdominal ultrasound.  No explanation for symptoms

## 2020-10-04 DIAGNOSIS — I129 Hypertensive chronic kidney disease with stage 1 through stage 4 chronic kidney disease, or unspecified chronic kidney disease: Secondary | ICD-10-CM | POA: Diagnosis not present

## 2020-10-04 DIAGNOSIS — E1122 Type 2 diabetes mellitus with diabetic chronic kidney disease: Secondary | ICD-10-CM | POA: Diagnosis not present

## 2020-10-06 DIAGNOSIS — E782 Mixed hyperlipidemia: Secondary | ICD-10-CM | POA: Diagnosis not present

## 2020-10-06 DIAGNOSIS — K219 Gastro-esophageal reflux disease without esophagitis: Secondary | ICD-10-CM | POA: Diagnosis not present

## 2020-10-06 DIAGNOSIS — I10 Essential (primary) hypertension: Secondary | ICD-10-CM | POA: Diagnosis not present

## 2020-10-06 DIAGNOSIS — E1165 Type 2 diabetes mellitus with hyperglycemia: Secondary | ICD-10-CM | POA: Diagnosis not present

## 2021-01-13 NOTE — Progress Notes (Signed)
Patient referred by [name], MD for chest pain  Subjective:   Cole Ford, male    DOB: 1975/07/16, 45 y.o.   MRN: [id number]  Chief Complaint  Patient presents with   Chest Pain   New Patient (Initial Visit)    Referred by Rachell Cipro, MD    HPI  45 y.o. AA male with history of hypertension, hyperlipidemia, GERD, diabetes mellitus.  Referred by his PCP for evaluation of chest pain.  Notably patient's father reportedly died of cardiac arrest at the age of 56, no known additional information.  Patient is active exercising 4 days/week, doing cardio workouts 3 days/week.  Over the last 2 years he has lost approximately 30 pounds.  Patient reports over the last 6 months he has had intermittent feelings of chest pressure occasionally associated with left arm and jaw discomfort.  These episodes are not with exertion, in fact he is able to exercise regularly without issue.  Patient states chest pressure last several hours.  He denies associated symptoms.  Past Medical History:  Diagnosis Date   Diabetes mellitus without complication ([place])    Hypercholesteremia    Hyperlipidemia 01/12/2012   Hypertension    Immune deficiency disorder ([place])     History reviewed. No pertinent surgical history.  Social History   Tobacco Use  Smoking Status Never  Smokeless Tobacco Never    Social History   Substance and Sexual Activity  Alcohol Use Yes   Comment: rare    Family History  Problem Relation Age of Onset   Hypertension Mother    Kidney failure Mother    Diabetes Father    Diabetes Maternal Grandmother    Diabetes Maternal Grandfather     Current Outpatient Medications on File Prior to Visit  Medication Sig Dispense Refill   aspirin EC 81 MG tablet Take 81 mg by mouth daily.     atorvastatin (LIPITOR) 10 MG tablet Take 10 mg by mouth daily.     losartan (COZAAR) 25 MG tablet Take 25 mg by mouth daily.     Omega-3 Fatty Acids (FISH OIL PO) Take 2 capsules  by mouth daily.      OZEMPIC, 0.25 OR 0.5 MG/DOSE, 2 MG/1.5ML SOPN Inject 0.5 mg into the skin once a week.     SYNJARDY XR 25-1000 MG TB24 Take 1 tablet by mouth every morning.     No current facility-administered medications on file prior to visit.    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 EGFR 82.  HbA1C 8.4% Chol 173, TG 133, HDL 64, LDL 86   Review of Systems  Constitutional: Negative for malaise/fatigue and weight gain.  Cardiovascular:  Positive for chest pain. Negative for claudication, leg swelling, near-syncope, orthopnea, palpitations, paroxysmal nocturnal dyspnea and syncope.  Respiratory:  Negative for shortness of breath.   Neurological:  Negative for dizziness.       Vitals:   01/16/21 0853  BP: 119/88  Pulse: 85  Resp: 16  Temp: 98 F (36.7 C)  SpO2: 97%     Body mass index is 33.83 kg/m. Filed Weights   01/16/21 0853  Weight: 216 lb (98 kg)    Objective:   Physical Exam Vitals reviewed.  HENT:     Head: Normocephalic and atraumatic.  Cardiovascular:     Rate and Rhythm: Normal rate and regular rhythm.     Pulses:  Intact distal pulses.     Heart sounds: S1 normal and S2 normal. No murmur heard.   No gallop.  Pulmonary:     Effort: Pulmonary effort is normal. No respiratory distress.     Breath sounds: No wheezing, rhonchi or rales.  Musculoskeletal:     Right lower leg: No edema.     Left lower leg: No edema.  Neurological:     Mental Status: He is alert.       Assessment & Recommendations:   45 y.o. AA male with history of hypertension, hyperlipidemia, GERD, diabetes mellitus.  Referred by his PCP for evaluation of chest pain.  Notably patient's father reportedly died of cardiac arrest at the age of 27, no known additional information.  Precordial pain:  Although patient's symptoms are atypical given  multiple cardiovascular risk factors including diabetes, hypertension, hyperlipidemia recommend further evaluation.  Will obtain echocardiogram Will obtain nuclear stress test Will obtain coronary calcium score for further risk stratification  Hypertension: Blood pressure is well controlled with losartan Will defer further management to PCP  Hyperlipidemia: I personally reviewed external labs, lipids are well controlled Continue statin therapy Will defer further management to PCP  Type 2 diabetes mellitus: Uncontrolled A1c 05/04/2020 8.4% Counseled patient regarding the importance of diabetes control and reducing long-term cardiovascular risk, he verbalized understanding. Will defer further management to PCP  Follow-up in 6 weeks for results of cardiac testing.  Patient was seen in collaboration with Dr. Virgina Jock. He also reviewed patient's chart and examined the patient. Dr. Virgina Jock is in agreement of the plan.    Alethia Berthold, PA-C 01/16/2021, 9:59 AM Office: [phone]

## 2021-01-16 ENCOUNTER — Other Ambulatory Visit: Payer: Self-pay

## 2021-01-16 ENCOUNTER — Ambulatory Visit: Payer: BC Managed Care – PPO | Admitting: Cardiology

## 2021-01-16 ENCOUNTER — Encounter: Payer: Self-pay | Admitting: Cardiology

## 2021-01-16 VITALS — BP 119/88 | HR 85 | Temp 98.0°F | Resp 16 | Ht 67.0 in | Wt 216.0 lb

## 2021-01-16 DIAGNOSIS — E7800 Pure hypercholesterolemia, unspecified: Secondary | ICD-10-CM | POA: Diagnosis not present

## 2021-01-16 DIAGNOSIS — R072 Precordial pain: Secondary | ICD-10-CM | POA: Diagnosis not present

## 2021-01-16 DIAGNOSIS — E118 Type 2 diabetes mellitus with unspecified complications: Secondary | ICD-10-CM | POA: Diagnosis not present

## 2021-01-16 DIAGNOSIS — I10 Essential (primary) hypertension: Secondary | ICD-10-CM

## 2021-01-16 DIAGNOSIS — Z794 Long term (current) use of insulin: Secondary | ICD-10-CM

## 2021-02-01 ENCOUNTER — Other Ambulatory Visit: Payer: BC Managed Care – PPO

## 2021-02-03 ENCOUNTER — Other Ambulatory Visit: Payer: BC Managed Care – PPO

## 2021-02-13 ENCOUNTER — Other Ambulatory Visit: Payer: Self-pay

## 2021-02-13 ENCOUNTER — Ambulatory Visit: Payer: BC Managed Care – PPO

## 2021-02-13 DIAGNOSIS — R072 Precordial pain: Secondary | ICD-10-CM

## 2021-02-13 DIAGNOSIS — I10 Essential (primary) hypertension: Secondary | ICD-10-CM

## 2021-02-13 DIAGNOSIS — E118 Type 2 diabetes mellitus with unspecified complications: Secondary | ICD-10-CM

## 2021-02-13 DIAGNOSIS — E7800 Pure hypercholesterolemia, unspecified: Secondary | ICD-10-CM

## 2021-02-14 NOTE — Progress Notes (Signed)
Will discuss at upcoming office visit

## 2021-02-16 ENCOUNTER — Other Ambulatory Visit: Payer: BC Managed Care – PPO

## 2021-02-22 ENCOUNTER — Ambulatory Visit: Payer: BC Managed Care – PPO

## 2021-02-22 ENCOUNTER — Other Ambulatory Visit: Payer: Self-pay

## 2021-02-22 DIAGNOSIS — E118 Type 2 diabetes mellitus with unspecified complications: Secondary | ICD-10-CM | POA: Diagnosis not present

## 2021-02-22 DIAGNOSIS — E7800 Pure hypercholesterolemia, unspecified: Secondary | ICD-10-CM | POA: Diagnosis not present

## 2021-02-22 DIAGNOSIS — I10 Essential (primary) hypertension: Secondary | ICD-10-CM | POA: Diagnosis not present

## 2021-02-22 DIAGNOSIS — R072 Precordial pain: Secondary | ICD-10-CM | POA: Diagnosis not present

## 2021-02-22 NOTE — Progress Notes (Deleted)
Patient referred by Cole Bien, MD for chest pain  Subjective:   Cole Ford, male    DOB: 05-19-1975, 45 y.o.   MRN: [id number]  No chief complaint on file.   HPI  45 y.o. AA male with history of hypertension, hyperlipidemia, GERD, diabetes mellitus.  Referred by his PCP for evaluation of chest pain.  Notably patient's father reportedly died of cardiac arrest at the age of 73, no known additional information.  Patient presents for 8-week follow-up for results of cardiac testing.  At last office visit ordered echocardiogram, stress test, and coronary calcium score.  Echocardiogram noted normal LVEF without significant abnormalities. ***  ***Stress test and Ca score?   Patient is active exercising 4 days/week, doing cardio workouts 3 days/week.  Over the last 2 years he has lost approximately 30 pounds.  Patient reports over the last 6 months he has had intermittent feelings of chest pressure occasionally associated with left arm and jaw discomfort.  These episodes are not with exertion, in fact he is able to exercise regularly without issue.  Patient states chest pressure last several hours.  He denies associated symptoms.  Past Medical History:  Diagnosis Date   Diabetes mellitus without complication ([place])    Hypercholesteremia    Hyperlipidemia 01/12/2012   Hypertension    Immune deficiency disorder ([place])     No past surgical history on file.  Social History   Tobacco Use  Smoking Status Never  Smokeless Tobacco Never    Social History   Substance and Sexual Activity  Alcohol Use Yes   Comment: rare    Family History  Problem Relation Age of Onset   Hypertension Mother    Kidney failure Mother    Diabetes Father    Diabetes Maternal Grandmother    Diabetes Maternal Grandfather     Current Outpatient Medications on File Prior to Visit  Medication Sig Dispense Refill   aspirin EC 81 MG tablet Take 81 mg by mouth daily.     atorvastatin (LIPITOR) 10  MG tablet Take 10 mg by mouth daily.     losartan (COZAAR) 25 MG tablet Take 25 mg by mouth daily.     Omega-3 Fatty Acids (FISH OIL PO) Take 2 capsules by mouth daily.      OZEMPIC, 0.25 OR 0.5 MG/DOSE, 2 MG/1.5ML SOPN Inject 0.5 mg into the skin once a week.     SYNJARDY XR 25-1000 MG TB24 Take 1 tablet by mouth every morning.     No current facility-administered medications on file prior to visit.    Cardiovascular and other pertinent studies:  PCV ECHOCARDIOGRAM COMPLETE 81/04/7508 Normal LV systolic function with visual EF 60-65%. Left ventricle cavity is normal in size. Normal left ventricular wall thickness. Normal global wall motion. Normal diastolic filling pattern, normal LAP. No significant valvular heart disease. No prior study for comparison.   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 EGFR 82.  HbA1C 8.4% Chol 173, TG 133, HDL 64, LDL 86   Review of Systems  Constitutional: Negative for malaise/fatigue and weight gain.  Cardiovascular:  Positive for chest pain. Negative for claudication, leg swelling, near-syncope, orthopnea, palpitations, paroxysmal nocturnal dyspnea and syncope.  Respiratory:  Negative for shortness of breath.   Neurological:  Negative for dizziness.       There were no vitals filed for this visit.    There is no height or weight on  file to calculate BMI. There were no vitals filed for this visit.   Objective:   Physical Exam Vitals reviewed.  HENT:     Head: Normocephalic and atraumatic.  Cardiovascular:     Rate and Rhythm: Normal rate and regular rhythm.     Pulses: Intact distal pulses.     Heart sounds: S1 normal and S2 normal. No murmur heard.   No gallop.  Pulmonary:     Effort: Pulmonary effort is normal. No respiratory distress.     Breath sounds: No wheezing, rhonchi or rales.  Musculoskeletal:     Right lower leg: No edema.     Left lower  leg: No edema.  Neurological:     Mental Status: He is alert.       Assessment & Recommendations:   45 y.o. AA male with history of hypertension, hyperlipidemia, GERD, diabetes mellitus.  Referred by his PCP for evaluation of chest pain.  Notably patient's father reportedly died of cardiac arrest at the age of 33, no known additional information.  Precordial pain: *** Although patient's symptoms are atypical given multiple cardiovascular risk factors including diabetes, hypertension, hyperlipidemia recommend further evaluation.  Will obtain echocardiogram Will obtain nuclear stress test Will obtain coronary calcium score for further risk stratification  Hypertension: Blood pressure is well controlled with losartan Will defer further management to PCP  Hyperlipidemia: I personally reviewed external labs, lipids are well controlled Continue statin therapy Will defer further management to PCP  Type 2 diabetes mellitus: Uncontrolled A1c 05/04/2020 8.4% Counseled patient regarding the importance of diabetes control and reducing long-term cardiovascular risk, he verbalized understanding. Will defer further management to PCP  Follow-up in 6 weeks for results of cardiac testing.  Patient was seen in collaboration with Dr. Virgina Jock. He also reviewed patient's chart and examined the patient. Dr. Virgina Jock is in agreement of the plan.    Alethia Berthold, PA-C 02/22/2021, 2:22 PM Office: [phone]

## 2021-02-27 ENCOUNTER — Ambulatory Visit: Payer: BC Managed Care – PPO | Admitting: Student

## 2021-02-27 DIAGNOSIS — R072 Precordial pain: Secondary | ICD-10-CM

## 2021-02-27 DIAGNOSIS — I10 Essential (primary) hypertension: Secondary | ICD-10-CM

## 2021-03-01 ENCOUNTER — Other Ambulatory Visit: Payer: Self-pay

## 2021-03-01 ENCOUNTER — Other Ambulatory Visit: Payer: BC Managed Care – PPO

## 2021-03-09 ENCOUNTER — Inpatient Hospital Stay: Admission: RE | Admit: 2021-03-09 | Payer: BC Managed Care – PPO | Source: Ambulatory Visit

## 2021-03-13 ENCOUNTER — Ambulatory Visit: Payer: BC Managed Care – PPO | Admitting: Student

## 2021-03-13 NOTE — Progress Notes (Deleted)
Patient referred by Cole Bien, MD for chest pain  Subjective:   Cole Ford, male    DOB: September 21, 1975, 45 y.o.   MRN: [id number]  No chief complaint on file.   HPI  45 y.o. AA male with history of hypertension, hyperlipidemia, GERD, diabetes mellitus.  Referred by his PCP for evaluation of chest pain.  Notably patient's father reportedly died of cardiac arrest at the age of 58, no known additional information.  Patient presents for 8-week follow-up for results of cardiac testing.  At last office visit ordered echocardiogram, stress test, and coronary calcium score.  Echocardiogram noted normal LVEF without significant abnormalities.  Stress test overall low risk. ***  ***Ca score?   Patient is active exercising 4 days/week, doing cardio workouts 3 days/week.  Over the last 2 years he has lost approximately 30 pounds.  Patient reports over the last 6 months he has had intermittent feelings of chest pressure occasionally associated with left arm and jaw discomfort.  These episodes are not with exertion, in fact he is able to exercise regularly without issue.  Patient states chest pressure last several hours.  He denies associated symptoms.  Past Medical History:  Diagnosis Date   Diabetes mellitus without complication ([place])    Hypercholesteremia    Hyperlipidemia 01/12/2012   Hypertension    Immune deficiency disorder ([place])     No past surgical history on file.  Social History   Tobacco Use  Smoking Status Never  Smokeless Tobacco Never    Social History   Substance and Sexual Activity  Alcohol Use Yes   Comment: rare    Family History  Problem Relation Age of Onset   Hypertension Mother    Kidney failure Mother    Diabetes Father    Diabetes Maternal Grandmother    Diabetes Maternal Grandfather     Current Outpatient Medications on File Prior to Visit  Medication Sig Dispense Refill   aspirin EC 81 MG tablet Take 81 mg by mouth daily.      atorvastatin (LIPITOR) 10 MG tablet Take 10 mg by mouth daily.     losartan (COZAAR) 25 MG tablet Take 25 mg by mouth daily.     Omega-3 Fatty Acids (FISH OIL PO) Take 2 capsules by mouth daily.      OZEMPIC, 0.25 OR 0.5 MG/DOSE, 2 MG/1.5ML SOPN Inject 0.5 mg into the skin once a week.     SYNJARDY XR 25-1000 MG TB24 Take 1 tablet by mouth every morning.     No current facility-administered medications on file prior to visit.    Cardiovascular and other pertinent studies: PCV MYOCARDIAL PERFUSION WO LEXISCAN 02/22/2021 1 Day Rest/Stress Protocol. Stress EKG is non-diagnostic due baseline ST-T changes. Exercise time 7 minutes 15 seconds on Bruce protocol, achieved 8.95 METS, 92% of APMHR. Normal myocardial perfusion without evidence of reversible myocardial ischemia or prior infarct. LVEF 48%, visually preserved, wall motion and thickness preserved. Low risk study.  PCV ECHOCARDIOGRAM COMPLETE 47/65/4650 Normal LV systolic function with visual EF 60-65%. Left ventricle cavity is normal in size. Normal left ventricular wall thickness. Normal global wall motion. Normal diastolic filling pattern, normal LAP. No significant valvular heart disease. No prior study for comparison.   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 EGFR 82.  HbA1C 8.4% Chol 173, TG 133, HDL 64, LDL 86   Review of Systems  Constitutional: Negative for malaise/fatigue and weight  gain.  Cardiovascular:  Positive for chest pain. Negative for claudication, leg swelling, near-syncope, orthopnea, palpitations, paroxysmal nocturnal dyspnea and syncope.  Respiratory:  Negative for shortness of breath.   Neurological:  Negative for dizziness.       There were no vitals filed for this visit.    There is no height or weight on file to calculate BMI. There were no vitals filed for this visit.   Objective:   Physical Exam Vitals  reviewed.  HENT:     Head: Normocephalic and atraumatic.  Cardiovascular:     Rate and Rhythm: Normal rate and regular rhythm.     Pulses: Intact distal pulses.     Heart sounds: S1 normal and S2 normal. No murmur heard.   No gallop.  Pulmonary:     Effort: Pulmonary effort is normal. No respiratory distress.     Breath sounds: No wheezing, rhonchi or rales.  Musculoskeletal:     Right lower leg: No edema.     Left lower leg: No edema.  Neurological:     Mental Status: He is alert.       Assessment & Recommendations:   45 y.o. AA male with history of hypertension, hyperlipidemia, GERD, diabetes mellitus.  Referred by his PCP for evaluation of chest pain.  Notably patient's father reportedly died of cardiac arrest at the age of 33, no known additional information.  Precordial pain: *** Although patient's symptoms are atypical given multiple cardiovascular risk factors including diabetes, hypertension, hyperlipidemia recommend further evaluation.  Will obtain echocardiogram Will obtain nuclear stress test Will obtain coronary calcium score for further risk stratification  Hypertension: Blood pressure is well controlled with losartan Will defer further management to PCP  Hyperlipidemia: I personally reviewed external labs, lipids are well controlled Continue statin therapy Will defer further management to PCP  Type 2 diabetes mellitus: Uncontrolled A1c 05/04/2020 8.4% Counseled patient regarding the importance of diabetes control and reducing long-term cardiovascular risk, he verbalized understanding. Will defer further management to PCP  Follow-up in 6 weeks for results of cardiac testing.  Patient was seen in collaboration with Dr. Virgina Jock. He also reviewed patient's chart and examined the patient. Dr. Virgina Jock is in agreement of the plan.    Alethia Berthold, PA-C 03/13/2021, 10:41 AM Office: [phone]

## 2021-03-22 DIAGNOSIS — E1169 Type 2 diabetes mellitus with other specified complication: Secondary | ICD-10-CM | POA: Diagnosis not present

## 2021-03-22 DIAGNOSIS — E1165 Type 2 diabetes mellitus with hyperglycemia: Secondary | ICD-10-CM | POA: Diagnosis not present

## 2021-03-22 DIAGNOSIS — E782 Mixed hyperlipidemia: Secondary | ICD-10-CM | POA: Diagnosis not present

## 2021-03-29 DIAGNOSIS — I10 Essential (primary) hypertension: Secondary | ICD-10-CM | POA: Diagnosis not present

## 2021-03-29 DIAGNOSIS — E1165 Type 2 diabetes mellitus with hyperglycemia: Secondary | ICD-10-CM | POA: Diagnosis not present

## 2021-03-29 DIAGNOSIS — E782 Mixed hyperlipidemia: Secondary | ICD-10-CM | POA: Diagnosis not present

## 2021-05-08 ENCOUNTER — Encounter (HOSPITAL_COMMUNITY): Payer: Self-pay

## 2021-05-08 ENCOUNTER — Ambulatory Visit (HOSPITAL_COMMUNITY)
Admission: EM | Admit: 2021-05-08 | Discharge: 2021-05-08 | Disposition: A | Discharge status: Home / Self Care | Payer: BC Managed Care – PPO | Attending: Urgent Care | Admitting: Urgent Care

## 2021-05-08 ENCOUNTER — Other Ambulatory Visit: Payer: Self-pay

## 2021-05-08 DIAGNOSIS — B9689 Other specified bacterial agents as the cause of diseases classified elsewhere: Secondary | ICD-10-CM | POA: Diagnosis not present

## 2021-05-08 DIAGNOSIS — L089 Local infection of the skin and subcutaneous tissue, unspecified: Secondary | ICD-10-CM | POA: Diagnosis not present

## 2021-05-08 DIAGNOSIS — H0289 Other specified disorders of eyelid: Secondary | ICD-10-CM | POA: Diagnosis not present

## 2021-05-08 MED ORDER — DOXYCYCLINE HYCLATE 100 MG PO CAPS: 100.0000 mg | ORAL_CAPSULE | Freq: Two times a day (BID) | ORAL | 0 refills | Status: AC

## 2021-05-08 MED ORDER — IBUPROFEN 600 MG PO TABS: 600.0000 mg | ORAL_TABLET | Freq: Four times a day (QID) | ORAL | 0 refills | Status: AC | PRN

## 2021-05-08 NOTE — ED Triage Notes (Signed)
Pt presents with c/o a bump on the R eye lid x 4 days.    States he saw a Ophthalmologist. Reports the bump has gotten worse and caused under eye discoloration.

## 2021-05-08 NOTE — Discharge Instructions (Signed)
Please make sure that you follow-up with your eye doctor soon as possible.  In the meantime, I am can address your symptoms for a skin infection of the eyelids with doxycycline.  Use ibuprofen for the pain and inflammation.

## 2021-05-08 NOTE — ED Provider Notes (Signed)
[facility]   MRN: [id number] DOB: May 01, 1975  Subjective:   Cole Ford is a 46 y.o. male presenting for 4-day history of acute onset persistent and worsening painful bumps over his eyes.  Symptoms started out with 1 over the right lower eyelid.  However, it has spread to the top right eyelid and now on the left upper eyelid.  Denies photophobia, vision changes, eye trauma, problems with the eye itself.  He did go to an ophthalmologist and states that they did not end up doing anything for his symptoms because it did not involve the eye itself.  No current facility-administered medications for this encounter.  Current Outpatient Medications:    aspirin EC 81 MG tablet, Take 81 mg by mouth daily., Disp: , Rfl:    atorvastatin (LIPITOR) 10 MG tablet, Take 10 mg by mouth daily., Disp: , Rfl:    losartan (COZAAR) 25 MG tablet, Take 25 mg by mouth daily., Disp: , Rfl:    Omega-3 Fatty Acids (FISH OIL PO), Take 2 capsules by mouth daily. , Disp: , Rfl:    OZEMPIC, 0.25 OR 0.5 MG/DOSE, 2 MG/1.5ML SOPN, Inject 0.5 mg into the skin once a week., Disp: , Rfl:    SYNJARDY XR 25-1000 MG TB24, Take 1 tablet by mouth every morning., Disp: , Rfl:    Allergies  Allergen Reactions   Metformin And Related Palpitations    Symptoms related to cardiac arrest.     Past Medical History:  Diagnosis Date   Diabetes mellitus without complication (HCC)    Hypercholesteremia    Hyperlipidemia 01/12/2012   Hypertension    Immune deficiency disorder (HCC)      History reviewed. No pertinent surgical history.  Family History  Problem Relation Age of Onset   Hypertension Mother    Kidney failure Mother    Diabetes Father    Diabetes Maternal Grandmother    Diabetes Maternal Grandfather     Social History   Tobacco Use   Smoking status: Never   Smokeless tobacco: Never  Vaping Use   Vaping Use: Never used  Substance Use Topics   Alcohol use: Yes    Comment: rare   Drug use:  No    ROS   Objective:   Vitals: BP 133/79 (BP Location: Left Arm)    Pulse 89    Temp 98.1 F (36.7 C) (Oral)    Resp 16    SpO2 98%   Physical Exam Constitutional:      General: He is not in acute distress.    Appearance: Normal appearance. He is well-developed and normal weight. He is not ill-appearing, toxic-appearing or diaphoretic.  HENT:     Head: Normocephalic and atraumatic.     Right Ear: External ear normal.     Left Ear: External ear normal.     Nose: Nose normal.     Mouth/Throat:     Pharynx: Oropharynx is clear.  Eyes:     General: Lids are everted, no foreign bodies appreciated. No scleral icterus.       Right eye: No foreign body, discharge or hordeolum.        Left eye: No foreign body, discharge or hordeolum.     Extraocular Movements: Extraocular movements intact.   Cardiovascular:     Rate and Rhythm: Normal rate.  Pulmonary:     Effort: Pulmonary effort is normal.  Musculoskeletal:     Cervical back: Normal range of motion.  Neurological:     Mental  Status: He is alert and oriented to person, place, and time.  Psychiatric:        Mood and Affect: Mood normal.        Behavior: Behavior normal.        Thought Content: Thought content normal.        Judgment: Judgment normal.         Assessment and Plan :   PDMP not reviewed this encounter.  1. Bacterial skin infection   2. Eyelid pain of both eyes     Case reviewed with Dr. Marlinda Mike.  Will cover patient for bacterial skin infection of the eyelids with doxycycline.  Naproxen for pain and inflammation.  Follow-up closely with an ophthalmologist. Counseled patient on potential for adverse effects with medications prescribed/recommended today, ER and return-to-clinic precautions discussed, patient verbalized understanding.    Wallis Bamberg, [place] 05/08/21 [id number]

## 2021-06-14 DIAGNOSIS — H00019 Hordeolum externum unspecified eye, unspecified eyelid: Secondary | ICD-10-CM | POA: Diagnosis not present

## 2021-06-14 DIAGNOSIS — H1010 Acute atopic conjunctivitis, unspecified eye: Secondary | ICD-10-CM | POA: Diagnosis not present

## 2021-06-14 DIAGNOSIS — E1165 Type 2 diabetes mellitus with hyperglycemia: Secondary | ICD-10-CM | POA: Diagnosis not present

## 2021-06-14 DIAGNOSIS — I10 Essential (primary) hypertension: Secondary | ICD-10-CM | POA: Diagnosis not present

## 2021-06-30 DIAGNOSIS — E1165 Type 2 diabetes mellitus with hyperglycemia: Secondary | ICD-10-CM | POA: Diagnosis not present

## 2021-06-30 DIAGNOSIS — E782 Mixed hyperlipidemia: Secondary | ICD-10-CM | POA: Diagnosis not present

## 2021-06-30 DIAGNOSIS — I10 Essential (primary) hypertension: Secondary | ICD-10-CM | POA: Diagnosis not present

## 2021-11-06 DIAGNOSIS — F439 Reaction to severe stress, unspecified: Secondary | ICD-10-CM | POA: Diagnosis not present

## 2021-11-14 DIAGNOSIS — F439 Reaction to severe stress, unspecified: Secondary | ICD-10-CM | POA: Diagnosis not present

## 2021-11-21 DIAGNOSIS — F439 Reaction to severe stress, unspecified: Secondary | ICD-10-CM | POA: Diagnosis not present

## 2021-11-24 DIAGNOSIS — F439 Reaction to severe stress, unspecified: Secondary | ICD-10-CM | POA: Diagnosis not present

## 2021-11-28 DIAGNOSIS — F439 Reaction to severe stress, unspecified: Secondary | ICD-10-CM | POA: Diagnosis not present

## 2021-12-05 DIAGNOSIS — F439 Reaction to severe stress, unspecified: Secondary | ICD-10-CM | POA: Diagnosis not present

## 2021-12-15 DIAGNOSIS — E785 Hyperlipidemia, unspecified: Secondary | ICD-10-CM | POA: Diagnosis not present

## 2021-12-15 DIAGNOSIS — I152 Hypertension secondary to endocrine disorders: Secondary | ICD-10-CM | POA: Diagnosis not present

## 2021-12-15 DIAGNOSIS — E1169 Type 2 diabetes mellitus with other specified complication: Secondary | ICD-10-CM | POA: Diagnosis not present

## 2021-12-15 DIAGNOSIS — E1159 Type 2 diabetes mellitus with other circulatory complications: Secondary | ICD-10-CM | POA: Diagnosis not present

## 2021-12-15 DIAGNOSIS — E119 Type 2 diabetes mellitus without complications: Secondary | ICD-10-CM | POA: Diagnosis not present

## 2021-12-26 DIAGNOSIS — F439 Reaction to severe stress, unspecified: Secondary | ICD-10-CM | POA: Diagnosis not present

## 2022-01-09 DIAGNOSIS — Z630 Problems in relationship with spouse or partner: Secondary | ICD-10-CM | POA: Diagnosis not present

## 2022-01-16 DIAGNOSIS — F439 Reaction to severe stress, unspecified: Secondary | ICD-10-CM | POA: Diagnosis not present

## 2022-01-23 DIAGNOSIS — F439 Reaction to severe stress, unspecified: Secondary | ICD-10-CM | POA: Diagnosis not present

## 2022-01-30 DIAGNOSIS — F439 Reaction to severe stress, unspecified: Secondary | ICD-10-CM | POA: Diagnosis not present

## 2022-02-13 DIAGNOSIS — F439 Reaction to severe stress, unspecified: Secondary | ICD-10-CM | POA: Diagnosis not present

## 2022-02-20 DIAGNOSIS — F439 Reaction to severe stress, unspecified: Secondary | ICD-10-CM | POA: Diagnosis not present

## 2022-03-06 DIAGNOSIS — F439 Reaction to severe stress, unspecified: Secondary | ICD-10-CM | POA: Diagnosis not present

## 2022-03-13 DIAGNOSIS — F439 Reaction to severe stress, unspecified: Secondary | ICD-10-CM | POA: Diagnosis not present

## 2022-03-19 DIAGNOSIS — E785 Hyperlipidemia, unspecified: Secondary | ICD-10-CM | POA: Diagnosis not present

## 2022-03-19 DIAGNOSIS — E119 Type 2 diabetes mellitus without complications: Secondary | ICD-10-CM | POA: Diagnosis not present

## 2022-03-19 DIAGNOSIS — E1159 Type 2 diabetes mellitus with other circulatory complications: Secondary | ICD-10-CM | POA: Diagnosis not present

## 2022-03-19 DIAGNOSIS — I152 Hypertension secondary to endocrine disorders: Secondary | ICD-10-CM | POA: Diagnosis not present

## 2022-03-19 DIAGNOSIS — E1169 Type 2 diabetes mellitus with other specified complication: Secondary | ICD-10-CM | POA: Diagnosis not present

## 2022-03-20 DIAGNOSIS — F439 Reaction to severe stress, unspecified: Secondary | ICD-10-CM | POA: Diagnosis not present

## 2022-03-27 DIAGNOSIS — F439 Reaction to severe stress, unspecified: Secondary | ICD-10-CM | POA: Diagnosis not present

## 2022-04-03 DIAGNOSIS — F439 Reaction to severe stress, unspecified: Secondary | ICD-10-CM | POA: Diagnosis not present

## 2022-04-10 DIAGNOSIS — F439 Reaction to severe stress, unspecified: Secondary | ICD-10-CM | POA: Diagnosis not present

## 2022-04-17 DIAGNOSIS — F439 Reaction to severe stress, unspecified: Secondary | ICD-10-CM | POA: Diagnosis not present

## 2022-04-20 DIAGNOSIS — I10 Essential (primary) hypertension: Secondary | ICD-10-CM | POA: Diagnosis not present

## 2022-04-20 DIAGNOSIS — E1169 Type 2 diabetes mellitus with other specified complication: Secondary | ICD-10-CM | POA: Diagnosis not present

## 2022-04-20 DIAGNOSIS — R079 Chest pain, unspecified: Secondary | ICD-10-CM | POA: Diagnosis not present

## 2022-04-20 DIAGNOSIS — E785 Hyperlipidemia, unspecified: Secondary | ICD-10-CM | POA: Diagnosis not present

## 2022-04-24 DIAGNOSIS — F439 Reaction to severe stress, unspecified: Secondary | ICD-10-CM | POA: Diagnosis not present

## 2022-05-01 DIAGNOSIS — F439 Reaction to severe stress, unspecified: Secondary | ICD-10-CM | POA: Diagnosis not present

## 2022-05-08 DIAGNOSIS — F439 Reaction to severe stress, unspecified: Secondary | ICD-10-CM | POA: Diagnosis not present

## 2022-05-15 DIAGNOSIS — F4312 Post-traumatic stress disorder, chronic: Secondary | ICD-10-CM | POA: Diagnosis not present

## 2022-05-22 DIAGNOSIS — F4312 Post-traumatic stress disorder, chronic: Secondary | ICD-10-CM | POA: Diagnosis not present

## 2022-05-29 DIAGNOSIS — F4312 Post-traumatic stress disorder, chronic: Secondary | ICD-10-CM | POA: Diagnosis not present

## 2022-06-05 DIAGNOSIS — F4312 Post-traumatic stress disorder, chronic: Secondary | ICD-10-CM | POA: Diagnosis not present

## 2022-06-12 DIAGNOSIS — F4312 Post-traumatic stress disorder, chronic: Secondary | ICD-10-CM | POA: Diagnosis not present

## 2022-07-17 DIAGNOSIS — F4312 Post-traumatic stress disorder, chronic: Secondary | ICD-10-CM | POA: Diagnosis not present

## 2022-07-24 DIAGNOSIS — F4312 Post-traumatic stress disorder, chronic: Secondary | ICD-10-CM | POA: Diagnosis not present

## 2022-07-31 DIAGNOSIS — F4312 Post-traumatic stress disorder, chronic: Secondary | ICD-10-CM | POA: Diagnosis not present

## 2022-08-09 DIAGNOSIS — F4312 Post-traumatic stress disorder, chronic: Secondary | ICD-10-CM | POA: Diagnosis not present

## 2022-08-21 DIAGNOSIS — F4312 Post-traumatic stress disorder, chronic: Secondary | ICD-10-CM | POA: Diagnosis not present

## 2022-08-28 DIAGNOSIS — F4312 Post-traumatic stress disorder, chronic: Secondary | ICD-10-CM | POA: Diagnosis not present

## 2022-09-04 DIAGNOSIS — F4312 Post-traumatic stress disorder, chronic: Secondary | ICD-10-CM | POA: Diagnosis not present

## 2022-09-11 DIAGNOSIS — F4312 Post-traumatic stress disorder, chronic: Secondary | ICD-10-CM | POA: Diagnosis not present

## 2022-09-18 DIAGNOSIS — F4312 Post-traumatic stress disorder, chronic: Secondary | ICD-10-CM | POA: Diagnosis not present

## 2023-12-17 ENCOUNTER — Telehealth: Payer: Self-pay

## 2023-12-17 NOTE — Telephone Encounter (Signed)
 Returned yesterday's phone call back to patient this morning to schedule his colonoscopy.  LVM for him to call back at my desk to schedule.  Thanks,  Pratt, CMA
